# Patient Record
Sex: Male | Born: 1950 | Race: White | Hispanic: No | Marital: Single
Health system: Southern US, Community
[De-identification: ages and names within clinical notes are randomized; demographics above are authoritative.]

## PROBLEM LIST (undated history)

## (undated) DIAGNOSIS — E871 Hypo-osmolality and hyponatremia: Secondary | ICD-10-CM

## (undated) DIAGNOSIS — F1010 Alcohol abuse, uncomplicated: Secondary | ICD-10-CM

## (undated) DIAGNOSIS — E119 Type 2 diabetes mellitus without complications: Secondary | ICD-10-CM

## (undated) DIAGNOSIS — I10 Essential (primary) hypertension: Secondary | ICD-10-CM

---

## 2011-09-30 DIAGNOSIS — F1020 Alcohol dependence, uncomplicated: Secondary | ICD-10-CM | POA: Insufficient documentation

## 2013-12-04 DIAGNOSIS — F17200 Nicotine dependence, unspecified, uncomplicated: Secondary | ICD-10-CM | POA: Insufficient documentation

## 2013-12-04 DIAGNOSIS — E118 Type 2 diabetes mellitus with unspecified complications: Secondary | ICD-10-CM | POA: Insufficient documentation

## 2013-12-04 DIAGNOSIS — I10 Essential (primary) hypertension: Secondary | ICD-10-CM | POA: Insufficient documentation

## 2014-04-20 DIAGNOSIS — C20 Malignant neoplasm of rectum: Secondary | ICD-10-CM | POA: Insufficient documentation

## 2020-12-29 ENCOUNTER — Inpatient Hospital Stay
Admission: EM | Admit: 2020-12-29 | Discharge: 2021-01-04 | DRG: 640 | Disposition: A | Discharge status: Home Health | Payer: Medicare PPO | Attending: Obstetrics and Gynecology | Admitting: Obstetrics and Gynecology

## 2020-12-29 ENCOUNTER — Encounter: Payer: Self-pay | Admitting: Emergency Medicine

## 2020-12-29 ENCOUNTER — Emergency Department: Payer: Medicare PPO

## 2020-12-29 ENCOUNTER — Other Ambulatory Visit: Payer: Self-pay

## 2020-12-29 DIAGNOSIS — F32A Depression, unspecified: Secondary | ICD-10-CM | POA: Diagnosis present

## 2020-12-29 DIAGNOSIS — Z20822 Contact with and (suspected) exposure to covid-19: Secondary | ICD-10-CM | POA: Diagnosis present

## 2020-12-29 DIAGNOSIS — R5381 Other malaise: Secondary | ICD-10-CM | POA: Diagnosis present

## 2020-12-29 DIAGNOSIS — E861 Hypovolemia: Secondary | ICD-10-CM | POA: Diagnosis present

## 2020-12-29 DIAGNOSIS — G9341 Metabolic encephalopathy: Secondary | ICD-10-CM | POA: Diagnosis not present

## 2020-12-29 DIAGNOSIS — E1140 Type 2 diabetes mellitus with diabetic neuropathy, unspecified: Secondary | ICD-10-CM | POA: Diagnosis present

## 2020-12-29 DIAGNOSIS — E785 Hyperlipidemia, unspecified: Secondary | ICD-10-CM | POA: Diagnosis present

## 2020-12-29 DIAGNOSIS — F10931 Alcohol use, unspecified with withdrawal delirium: Secondary | ICD-10-CM

## 2020-12-29 DIAGNOSIS — Z85048 Personal history of other malignant neoplasm of rectum, rectosigmoid junction, and anus: Secondary | ICD-10-CM

## 2020-12-29 DIAGNOSIS — D649 Anemia, unspecified: Secondary | ICD-10-CM | POA: Diagnosis present

## 2020-12-29 DIAGNOSIS — D6959 Other secondary thrombocytopenia: Secondary | ICD-10-CM | POA: Diagnosis present

## 2020-12-29 DIAGNOSIS — N400 Enlarged prostate without lower urinary tract symptoms: Secondary | ICD-10-CM | POA: Diagnosis present

## 2020-12-29 DIAGNOSIS — E871 Hypo-osmolality and hyponatremia: Secondary | ICD-10-CM | POA: Diagnosis present

## 2020-12-29 DIAGNOSIS — E876 Hypokalemia: Secondary | ICD-10-CM | POA: Diagnosis not present

## 2020-12-29 DIAGNOSIS — G928 Other toxic encephalopathy: Secondary | ICD-10-CM | POA: Diagnosis present

## 2020-12-29 DIAGNOSIS — A084 Viral intestinal infection, unspecified: Secondary | ICD-10-CM | POA: Diagnosis present

## 2020-12-29 DIAGNOSIS — I10 Essential (primary) hypertension: Secondary | ICD-10-CM | POA: Diagnosis present

## 2020-12-29 DIAGNOSIS — F1010 Alcohol abuse, uncomplicated: Secondary | ICD-10-CM | POA: Diagnosis not present

## 2020-12-29 DIAGNOSIS — F10231 Alcohol dependence with withdrawal delirium: Secondary | ICD-10-CM | POA: Diagnosis not present

## 2020-12-29 DIAGNOSIS — R4182 Altered mental status, unspecified: Secondary | ICD-10-CM

## 2020-12-29 HISTORY — DX: Essential (primary) hypertension: I10

## 2020-12-29 HISTORY — DX: Alcohol abuse, uncomplicated: F10.10

## 2020-12-29 HISTORY — DX: Hypo-osmolality and hyponatremia: E87.1

## 2020-12-29 HISTORY — DX: Type 2 diabetes mellitus without complications: E11.9

## 2020-12-29 LAB — CBC WITH DIFFERENTIAL/PLATELET
Abs Immature Granulocytes: 0.28 10*3/uL — ABNORMAL HIGH (ref 0.00–0.07)
Basophils Absolute: 0 10*3/uL (ref 0.0–0.1)
Basophils Relative: 0 %
Eosinophils Absolute: 0 10*3/uL (ref 0.0–0.5)
Eosinophils Relative: 0 %
Hemoglobin: 15.1 g/dL (ref 13.0–17.0)
Immature Granulocytes: 2 %
Lymphocytes Relative: 3 %
Lymphs Abs: 0.6 10*3/uL — ABNORMAL LOW (ref 0.7–4.0)
Monocytes Absolute: 1.1 10*3/uL — ABNORMAL HIGH (ref 0.1–1.0)
Monocytes Relative: 6 %
Neutro Abs: 16.1 10*3/uL — ABNORMAL HIGH (ref 1.7–7.7)
Neutrophils Relative %: 89 %
Platelets: 126 10*3/uL — ABNORMAL LOW (ref 150–400)
Smear Review: NORMAL
WBC: 18.2 10*3/uL — ABNORMAL HIGH (ref 4.0–10.5)

## 2020-12-29 LAB — URINE DRUG SCREEN, QUALITATIVE (ARMC ONLY)
Amphetamines, Ur Screen: NOT DETECTED
Barbiturates, Ur Screen: NOT DETECTED
Benzodiazepine, Ur Scrn: NOT DETECTED
Cannabinoid 50 Ng, Ur ~~LOC~~: NOT DETECTED
Cocaine Metabolite,Ur ~~LOC~~: NOT DETECTED
MDMA (Ecstasy)Ur Screen: NOT DETECTED
Methadone Scn, Ur: NOT DETECTED
Opiate, Ur Screen: NOT DETECTED
Phencyclidine (PCP) Ur S: NOT DETECTED
Tricyclic, Ur Screen: NOT DETECTED

## 2020-12-29 LAB — SODIUM
Sodium: <102 mmol/L — CL (ref 135–145)
Sodium: <102 mmol/L — CL (ref 135–145)
Sodium: 103 mmol/L — CL (ref 135–145)
Sodium: 105 mmol/L — CL (ref 135–145)

## 2020-12-29 LAB — URINALYSIS, ROUTINE W REFLEX MICROSCOPIC
Bilirubin Urine: NEGATIVE
Glucose, UA: 150 mg/dL — AB
Ketones, ur: 20 mg/dL — AB
Leukocytes,Ua: NEGATIVE
Nitrite: NEGATIVE
Protein, ur: 100 mg/dL — AB
Specific Gravity, Urine: 1.013 (ref 1.005–1.030)
Squamous Epithelial / HPF: NONE SEEN (ref 0–5)
pH: 6 (ref 5.0–8.0)

## 2020-12-29 LAB — RESP PANEL BY RT-PCR (FLU A&B, COVID) ARPGX2
Influenza A by PCR: NEGATIVE
Influenza B by PCR: NEGATIVE
SARS Coronavirus 2 by RT PCR: NEGATIVE

## 2020-12-29 LAB — COMPREHENSIVE METABOLIC PANEL
ALT: 41 U/L (ref 0–44)
AST: 108 U/L — ABNORMAL HIGH (ref 15–41)
Albumin: 4 g/dL (ref 3.5–5.0)
Alkaline Phosphatase: 48 U/L (ref 38–126)
BUN: 16 mg/dL (ref 8–23)
CO2: 26 mmol/L (ref 22–32)
Calcium: 8.4 mg/dL — ABNORMAL LOW (ref 8.9–10.3)
Chloride: <65 mmol/L — CL (ref 98–111)
Creatinine, Ser: 0.94 mg/dL (ref 0.61–1.24)
GFR, Estimated: >60 mL/min (ref >60)
Glucose, Bld: 156 mg/dL — ABNORMAL HIGH (ref 70–99)
Potassium: 3.6 mmol/L (ref 3.5–5.1)
Sodium: <102 mmol/L — CL (ref 135–145)
Total Bilirubin: 1.7 mg/dL — ABNORMAL HIGH (ref 0.3–1.2)
Total Protein: 7 g/dL (ref 6.5–8.1)

## 2020-12-29 LAB — OSMOLALITY, URINE: Osmolality, Ur: 384 mOsm/kg (ref 300–900)

## 2020-12-29 LAB — LIPASE, BLOOD: Lipase: 46 U/L (ref 11–51)

## 2020-12-29 LAB — MRSA NEXT GEN BY PCR, NASAL: MRSA by PCR Next Gen: NOT DETECTED

## 2020-12-29 LAB — ETHANOL: Alcohol, Ethyl (B): <10 mg/dL (ref <10)

## 2020-12-29 LAB — SODIUM, URINE, RANDOM: Sodium, Ur: <10 mmol/L

## 2020-12-29 LAB — GLUCOSE, CAPILLARY: Glucose-Capillary: 126 mg/dL — ABNORMAL HIGH (ref 70–99)

## 2020-12-29 LAB — OSMOLALITY: Osmolality: 213 mOsm/kg — CL (ref 275–295)

## 2020-12-29 MED ORDER — CALCIUM GLUCONATE-NACL 1-0.675 GM/50ML-% IV SOLN
1.0000 g | Freq: Once | INTRAVENOUS | Status: AC
  Administered 2020-12-29: 1000 mg via INTRAVENOUS
  Filled 2020-12-29: qty 50

## 2020-12-29 MED ORDER — FOLIC ACID 1 MG PO TABS
1.0000 mg | ORAL_TABLET | Freq: Every day | ORAL | Status: DC
  Administered 2020-12-31 – 2021-01-04 (×5): 1 mg via ORAL
  Filled 2020-12-29 (×5): qty 1

## 2020-12-29 MED ORDER — DOCUSATE SODIUM 100 MG PO CAPS: 100.0000 mg | ORAL_CAPSULE | Freq: Two times a day (BID) | ORAL | Status: DC | PRN

## 2020-12-29 MED ORDER — THIAMINE HCL 100 MG/ML IJ SOLN
250.0000 mg | INTRAVENOUS | Status: AC
  Administered 2021-01-01 – 2021-01-02 (×2): 250 mg via INTRAVENOUS
  Filled 2020-12-29 (×2): qty 2.5

## 2020-12-29 MED ORDER — ACETAMINOPHEN 325 MG PO TABS
650.0000 mg | ORAL_TABLET | ORAL | Status: DC | PRN
  Administered 2020-12-31: 650 mg via ORAL
  Filled 2020-12-29: qty 2

## 2020-12-29 MED ORDER — LORAZEPAM 1 MG PO TABS: 1.0000 mg | ORAL_TABLET | ORAL | Status: AC | PRN

## 2020-12-29 MED ORDER — LORAZEPAM 2 MG/ML IJ SOLN
1.0000 mg | INTRAMUSCULAR | Status: AC | PRN
  Administered 2020-12-29: 3 mg via INTRAVENOUS
  Administered 2020-12-29: 2 mg via INTRAVENOUS
  Administered 2020-12-29: 4 mg via INTRAVENOUS
  Administered 2020-12-29: 2 mg via INTRAVENOUS
  Filled 2020-12-29: qty 2
  Filled 2020-12-29 (×2): qty 1
  Filled 2020-12-29: qty 2

## 2020-12-29 MED ORDER — LORAZEPAM 2 MG/ML IJ SOLN
INTRAMUSCULAR | Status: AC
  Administered 2020-12-29: 2 mg via INTRAVENOUS
  Filled 2020-12-29: qty 1

## 2020-12-29 MED ORDER — SODIUM CHLORIDE 3 % IV SOLN
INTRAVENOUS | Status: DC
  Filled 2020-12-29 (×3): qty 500

## 2020-12-29 MED ORDER — POLYETHYLENE GLYCOL 3350 17 G PO PACK: 17.0000 g | PACK | Freq: Every day | ORAL | Status: DC | PRN

## 2020-12-29 MED ORDER — THIAMINE HCL 100 MG/ML IJ SOLN: 100.0000 mg | Freq: Every day | INTRAMUSCULAR | Status: DC

## 2020-12-29 MED ORDER — THIAMINE HCL 100 MG/ML IJ SOLN
100.0000 mg | Freq: Every day | INTRAMUSCULAR | Status: DC
  Administered 2021-01-03: 100 mg via INTRAVENOUS
  Filled 2020-12-29 (×2): qty 2

## 2020-12-29 MED ORDER — THIAMINE HCL 100 MG PO TABS: 100.0000 mg | ORAL_TABLET | Freq: Every day | ORAL | Status: DC

## 2020-12-29 MED ORDER — ONDANSETRON HCL 4 MG/2ML IJ SOLN: 4.0000 mg | Freq: Four times a day (QID) | INTRAMUSCULAR | Status: DC | PRN

## 2020-12-29 MED ORDER — HEPARIN SODIUM (PORCINE) 5000 UNIT/ML IJ SOLN
5000.0000 [IU] | Freq: Three times a day (TID) | INTRAMUSCULAR | Status: DC
  Administered 2020-12-29 – 2020-12-31 (×5): 5000 [IU] via SUBCUTANEOUS
  Filled 2020-12-29 (×5): qty 1

## 2020-12-29 MED ORDER — ADULT MULTIVITAMIN W/MINERALS CH
1.0000 | ORAL_TABLET | Freq: Every day | ORAL | Status: DC
  Administered 2020-12-31 – 2021-01-04 (×5): 1 via ORAL
  Filled 2020-12-29 (×5): qty 1

## 2020-12-29 MED ORDER — DEXMEDETOMIDINE HCL IN NACL 400 MCG/100ML IV SOLN
0.1000 ug/kg/h | INTRAVENOUS | Status: AC
  Administered 2020-12-29: 0.4 ug/kg/h via INTRAVENOUS
  Administered 2020-12-30: 0.2 ug/kg/h via INTRAVENOUS
  Filled 2020-12-29 (×2): qty 100

## 2020-12-29 MED ORDER — THIAMINE HCL 100 MG/ML IJ SOLN
500.0000 mg | Freq: Three times a day (TID) | INTRAVENOUS | Status: AC
  Administered 2020-12-29 – 2020-12-31 (×8): 500 mg via INTRAVENOUS
  Filled 2020-12-29 (×8): qty 5

## 2020-12-29 MED ORDER — CHLORHEXIDINE GLUCONATE CLOTH 2 % EX PADS
6.0000 | MEDICATED_PAD | Freq: Every day | CUTANEOUS | Status: DC
  Administered 2020-12-29 – 2021-01-01 (×6): 6 via TOPICAL

## 2020-12-29 NOTE — ED Notes (Addendum)
This NT put patients things in bag with RN Karena Addison  1 [employer] wallet  1 Blue jacket  1 Black charger   1 Pair of black shoes  1 Pair of gray socks  1 Sliver key  Multiple keys 1 Camo Hat  1 Camo pants  1 Brown belt  1 [employer]  1 [employer]

## 2020-12-29 NOTE — Progress Notes (Addendum)
[facility] Progress Note Patient Name: Daemien Fronczak DOB: 1950/09/18 MRN: [id number]   Date of Service  12/29/2020  HPI/Events of Note  Patient with inadequate attenuation of delirium with CIWA Protocol Ativan.  eICU Interventions  Low dose Precedex gtt added. Serum sodium up 3 meq in past 9 hours, which is an acceptable rate of increase, will continue to monitor closely.        Kerry Kass Noah Pelaez 12/29/2020, 10:52 PM

## 2020-12-29 NOTE — Consult Note (Addendum)
PHARMACY CONSULT NOTE - FOLLOW UP  Pharmacy Consult for Electrolyte Monitoring and Replacement   Recent Labs: Potassium (mmol/L)  Date Value  12/29/2020 3.6   Calcium (mg/dL)  Date Value  12/29/2020 8.4 (L)   Albumin (g/dL)  Date Value  12/29/2020 4.0   Sodium (mmol/L)  Date Value  12/29/2020 105 (LL)     Assessment: 70 y/o male presented with N/V/D x 1 week. Found to have severely low Na of < 102, Cl < 65. Initiated on 3% NaCl infusion at 25 mL/hr. Pharmacy has been consulted to monitor and replace electrolytes as needed  12/5 1321  <102 12/5 1421  3% saline drip started 12/5 1548  <102 12/5 1826  103 12/5 2143  105  Goal of Therapy:  Electrolytes WNL No increase in Na in a 2 hr period > 4 mEq/L No increase in Na in a 4 hr period > 6 mEq/L No increase in Na in a 24 hr period > 8 mEq/L   Plan:  Hyponatremia -- continue 3% NaCl at 25 ml/hr per nephology. Check Na q4h Recheck BMP, Mag, Phos with AM labs   Trevor Meyers D ,PharmD Clinical Pharmacist 12/29/2020 10:44 PM

## 2020-12-29 NOTE — ED Triage Notes (Signed)
Pt in with co n.v.d for a week, per EMS pt has also had AMS. Pt states hx of the same and had low sodium. Pt denies any pain, alert and awake at this time.

## 2020-12-29 NOTE — H&P (Addendum)
NAME:  Trevor Meyers, MRN:  [id number], DOB:  1950-11-11, LOS: 0 ADMISSION DATE:  12/29/2020, CONSULTATION DATE:  12/29/20 REFERRING MD:  Dr. Charna Archer, CHIEF COMPLAINT:  AMS, generalized weakness, s/p fall, N/V/D   Brief Pt Description / Synopsis:  70 y.o. Male admitted with Acute Metabolic Encephalopathy in the setting of severe Hyponatremia (Hypotonic Hypovolemic) due to GI losses, along with developing Delirium Tremens.  History of Present Illness:  Trevor Meyers is a 70 year old male with a past medical history significant for alcohol abuse (reports 6-8 beers per day), hyponatremia requiring hospitalization, hypertension, diabetes mellitus who presented to [facility] ED on 12/29/2020 due to complaints of generalized weakness status post fall at home, nausea, vomiting, diarrhea.  Patient is currently confused and seems to be exhibiting signs of developing DT's upon my examination the ED, however no focal neuro deficits noted and patient is able to contribute to history. He reports about a 1 week history of persistent nausea, vomiting (non bloody), diarrhea (non bloody). He denies chest pain,  palpitations, dizziness, cough, shortness of breath, fever, chills, dysuria, abdominal pain, hematemesis or hematochezia.  Over the past 3 days he began to have progressive weakness and began to feel unsteady on his feet, and reported he had a fall this morning and hit his head.  Upon EMS arrival he was noted to be slightly disoriented.  EMS gave a 500 cc bolus of normal saline.  ED Course: Initial vital signs: Temperature 98.4 F orally, respiratory rate 20, pulse 79, blood pressure 190/89, SPO2 96% on room air Significant labs: Sodium < 102, chloride less than 65, glucose 156, BUN 16, creatinine 0.94, alkaline phosphatase 48, lipase 46, AST 108, ALT 41, WBC 18.2, platelets 126 (smear unremarkable), serum ethyl alcohol <10, serum osmolality 213 SARS-CoV-2 and influenza PCR negative Urine drug screen is pending Urine  osmolality and urine sodium pending Imaging: CT head and CT cervical spine>>IMPRESSION: 1. No acute intracranial abnormalities. 2. No acute abnormality of the cervical spine. 3. Mild cerebral atrophy with chronic microvascular ischemic changes in the cerebral white matter, as above. 4. Mild multilevel degenerative disc disease and cervical spondylosis, as above. Chest x-ray>>No active cardiopulmonary disease. Medications given: 3% hypertonic saline infusion at 25 mils per hour  PCCM is asked to admit the patient to ICU for further work-up and treatment of Acute Metabolic Encephalopathy in the setting of severe Hyponatremia (Hypotonic Hypovolemic) due to GI losses, along with developing Delirium Tremens.  Nephrology is consulted.   Pertinent  Medical History  Alcohol Abuse Hyponatremia Hypertension Hyperlipidemia Diabetes Mellitus  Micro Data:  12/29/2020: SARS-CoV-2 and influenza PCR>> negative 12/29/2020: GI panel by PCR>> 12/29/2020: C. Difficile>>  Antimicrobials:  N/A  Significant Hospital Events: Including procedures, antibiotic start and stop dates in addition to other pertinent events   12/29/2020: Presented to ED, found to have severe hyponatremia requiring 3% hypertonic saline.  PCCM asked to admit, nephrology consulted.  Starting to exhibit signs of developing DTs.  Interim History / Subjective:  -Patient presented to ED due to weakness with associated fall earlier today, reports about a day history of nausea, vomiting, diarrhea -CT Head and CT cervical spine both negative -Found to have severe hyponatremia with serum sodium less than 102 -Received 500 cc normal saline bolus with EMS, placed on 3% hypertonic saline in the ED -PCCM asked to admit, nephrology consulted -Upon examination in the ED patient exhibiting signs of developing DTs (anxious/restless, diaphoretic, tremulous) -He reports he drinks approximately 6-8 beers per day, with his last drink  the night before  last -Enacting CIWA protocol -Pt reports nausea ~ last known emesis or diarrhea was last night per his report ~ check GI panel & C-diff -Currently protecting his airway, but high risk for intubation  Objective   Blood pressure (!) 155/77, pulse 76, temperature 98.4 F (36.9 C), temperature source Oral, resp. rate 19, height 6\' 1"  (1.854 m), weight 93 kg, SpO2 97 %.       No intake or output data in the 24 hours ending 12/29/20 1402 Filed Weights   12/29/20 1130  Weight: 93 kg    Examination: General: Acutely ill-appearing male, laying in bed, on room air, anxious/restless/tremulous/diaphoretic HENT: Atraumatic, normocephalic, neck supple, no JVD Lungs: Clear breath sounds to auscultation bilaterally, no wheezing or rales noted, even, nonlabored Cardiovascular: Regular rate and rhythm, S1-S2, no murmurs, rubs, gallops Abdomen: Soft, nontender, nondistended, no guarding rebound tenderness, bowel sounds positive x4 Extremities: Normal bulk and tone, generalized weakness, no deformities, no edema Neuro: Awake but slightly confused, answers questions appropriately, moves all extremities to commands, no focal deficits, pupils PERRLA GU: Deferred Skin: Warm and diaphoretic.  No obvious rashes, lesions, ulcerations  Resolved Hospital Problem list     Assessment & Plan:   Severe Hyponatremia (Hypotonic Hypovolemic) due to GI losses over the last week -Monitor I&O's / urinary output -Follow BMP -Ensure adequate renal perfusion -Avoid nephrotoxic agents as able -Replace electrolytes as indicated -Serum Osmolality 213 -Urine osmolality, & urine Na+ pending -Hypertonic 3% Saline infusion ~ pharmacy following -Follow Serum Na+ q2h x2, then q4h -Goal rate of Correction: 4-6 mEq in 24 hrs (MAX RATE OF CORRECTION: 8 mEq in 24 hrs) -Nephrology consulted, appreciate input  Acute Metabolic Encephalopathy in the setting of severe Hyponatremia High risk for development of DT's PMHx: ETOH  abuse (pt reports 6-8 beers/day) -Correction of Na+ as above -CT Head negative for any acute intracranial process 12/5 -Urine drug screen is pending -Serum Ethyl alcohol  <10 -CIWA Protocol -Precedex if needed -Thiamine, Folic acid, MV supplementation -Seizure precautions -Currently protecting his airway, but HIGH RISK FOR INTUBATION -Provide supportive care  Leukocytosis, suspect due to Viral Gastroenteritis  -Monitor fever curve -Trend WBC's & Procalcitonin -Follow cultures as above -CXR 12/29/20 without acute process -Urinalysis is pending -Abdominal exam is benign ~ will check GI panel and C-diff panel if any further diarrhea -Will hold off on empiric antibiotics at this time pending cultures & sensitivities  Mild Thrombocytopenia, suspect due to ETOH abuse -Monitor for S/Sx of bleeding -Trend CBC -Heparin SQ for VTE Prophylaxis  -Transfuse for Hgb <7 -Smear is normal      Best Practice (right click and "Reselect all SmartList Selections" daily)   Diet/type: NPO DVT prophylaxis: prophylactic heparin  GI prophylaxis: PPI Lines: N/A Foley:  N/A Code Status:  full code Last date of multidisciplinary goals of care discussion [N/A]  Pt is critically ill due to severe hyponatremia and developing DT's.  He is high risk for seizures, intubation, cardiac arrest, and death.  Labs   CBC: Recent Labs  Lab 12/29/20 1132  WBC 18.2*  NEUTROABS 16.1*  HGB 15.1  HCT RESULTS UNAVAILABLE DUE TO INTERFERING SUBSTANCE  MCV RESULTS UNAVAILABLE DUE TO INTERFERING SUBSTANCE  PLT 126*    Basic Metabolic Panel: Recent Labs  Lab 12/29/20 1132 12/29/20 1321  NA <102* <102*  K 3.6  --   CL <65*  --   CO2 26  --   GLUCOSE 156*  --   BUN 16  --  CREATININE 0.94  --   CALCIUM 8.4*  --    GFR: Estimated Creatinine Clearance: 83.8 mL/min (by C-G formula based on SCr of 0.94 mg/dL). Recent Labs  Lab 12/29/20 1132  WBC 18.2*    Liver Function Tests: Recent Labs  Lab  12/29/20 1132  AST 108*  ALT 41  ALKPHOS 48  BILITOT 1.7*  PROT 7.0  ALBUMIN 4.0   Recent Labs  Lab 12/29/20 1132  LIPASE 46   No results for input(s): AMMONIA in the last 168 hours.  ABG No results found for: PHART, PCO2ART, PO2ART, HCO3, TCO2, ACIDBASEDEF, O2SAT   Coagulation Profile: No results for input(s): INR, PROTIME in the last 168 hours.  Cardiac Enzymes: No results for input(s): CKTOTAL, CKMB, CKMBINDEX, TROPONINI in the last 168 hours.  HbA1C: No results found for: HGBA1C  CBG: No results for input(s): GLUCAP in the last 168 hours.  Review of Systems:   Positives in BOLD: Gen: Denies fever, chills, weight change, fatigue, night sweats HEENT: Denies blurred vision, double vision, hearing loss, tinnitus, sinus congestion, rhinorrhea, sore throat, neck stiffness, dysphagia PULM: Denies shortness of breath, cough, sputum production, hemoptysis, wheezing CV: Denies chest pain, edema, orthopnea, paroxysmal nocturnal dyspnea, palpitations GI: Denies abdominal pain, nausea, vomiting, diarrhea, hematochezia, melena, constipation, change in bowel habits GU: Denies dysuria, hematuria, polyuria, oliguria, urethral discharge Endocrine: Denies hot or cold intolerance, polyuria, polyphagia or appetite change Derm: Denies rash, dry skin, scaling or peeling skin change Heme: Denies easy bruising, bleeding, bleeding gums Neuro: Denies headache, numbness, generalized weakness, slurred speech, loss of memory or consciousness   Past Medical History:  He,  has a past medical history of Alcohol abuse, Diabetes ([place]), Hypertension, and Hyponatremia.   Surgical History:  History reviewed. No pertinent surgical history.   Social History:      Family History:  His family history is not on file.   Allergies Not on File   Home Medications  Prior to Admission medications   Not on File     Critical care time: 55 minutes     Darel Hong, AGACNP-BC [place] epic messenger for cross cover needs If after hours, please call E-link

## 2020-12-29 NOTE — Consult Note (Signed)
PHARMACY CONSULT NOTE - FOLLOW UP  Pharmacy Consult for Electrolyte Monitoring and Replacement   Recent Labs: Potassium (mmol/L)  Date Value  12/29/2020 3.6   Calcium (mg/dL)  Date Value  12/29/2020 8.4 (L)   Albumin (g/dL)  Date Value  12/29/2020 4.0   Sodium (mmol/L)  Date Value  12/29/2020 <102 (LL)     Assessment: 70 y/o male presented with N/V/D x 1 week. Found to have severely low Na of < 102, Cl < 65. Initiated on 3% NaCl infusion at 25 mL/hr. Pharmacy has been consulted to monitor and replace electrolytes as needed  12/5 1321  <102 12/5 1421  3% saline drip started 12/5 1548  <102  Goal of Therapy:  Electrolytes WNL  Plan:  Hyponatremia -- continue 3% NaCl at 25 ml/hr per nephology. Check Na again in 2 hrs then q4h Hypocalcemia -- IV calcium gluconate 1 gram x 1  Recheck BMP, Mag, Phos with AM labs   Noralee Space ,PharmD Clinical Pharmacist 12/29/2020 5:37 PM

## 2020-12-29 NOTE — ED Notes (Signed)
Pt tolerated 2-3 sips of water.

## 2020-12-29 NOTE — ED Provider Notes (Signed)
[facility] Emergency Department Provider Note   ____________________________________________   Event Date/Time   First MD Initiated Contact with Patient 12/29/20 1248     (approximate)  I have reviewed the triage vital signs and the nursing notes.   HISTORY  Chief Complaint Vomiting and Diarrhea    HPI Trevor Meyers is a 70 y.o. male with past medical history of hypertension, diabetes, and alcohol abuse who presents to the ED for vomiting and diarrhea.  Patient reports that he has had 1 week of persistent nausea, vomiting, and diarrhea.  He states he has begun to feel unsteady on his feet over the past 3 days, having significant difficulty walking on his own.  He is feeling much weaker than usual and per EMS has been slightly disoriented.  He was given 500 cc bolus of normal saline by EMS, currently denies any abdominal pain and has not noticed any blood in his emesis or stool.  He reports issues with low sodium in the past requiring admission.  He does admit to alcohol consumption recently, about 6-8 beers daily.        Past Medical History:  Diagnosis Date   Alcohol abuse    Diabetes ([place])    Hypertension    Hyponatremia     There are no problems to display for this patient.   History reviewed. No pertinent surgical history.  Prior to Admission medications   Not on File    Allergies Patient has no allergy information on record.  No family history on file.  Social History    Review of Systems  Constitutional: No fever/chills.  Positive for confusion. Eyes: No visual changes. ENT: No sore throat. Cardiovascular: Denies chest pain. Respiratory: Denies shortness of breath. Gastrointestinal: No abdominal pain.  No nausea, no vomiting.  No diarrhea.  No constipation. Genitourinary: Negative for dysuria. Musculoskeletal: Negative for back pain. Skin: Negative for rash. Neurological: Negative for headaches, positive for unsteady gait,  negative for focal weakness or numbness.  ____________________________________________   PHYSICAL EXAM:  VITAL SIGNS: ED Triage Vitals  Enc Vitals Group     BP 12/29/20 1127 (!) 190/89     Pulse Rate 12/29/20 1125 79     Resp 12/29/20 1125 20     Temp 12/29/20 1125 98.4 F (36.9 C)     Temp Source 12/29/20 1125 Oral     SpO2 12/29/20 1125 96 %     Weight 12/29/20 1130 205 lb (93 kg)     Height 12/29/20 1130 6\' 1"  (1.854 m)     Head Circumference --      Peak Flow --      Pain Score 12/29/20 1125 0     Pain Loc --      Pain Edu? --      Excl. in [place]? --     Constitutional: Alert and oriented to person, place, time, and situation. Eyes: Conjunctivae are normal. Head: Atraumatic. Nose: No congestion/rhinnorhea. Mouth/Throat: Mucous membranes are dry. Neck: Normal ROM Cardiovascular: Normal rate, regular rhythm. Grossly normal heart sounds.  2+ radial pulses bilaterally. Respiratory: Normal respiratory effort.  No retractions. Lungs CTAB. Gastrointestinal: Soft and nontender. No distention. Genitourinary: deferred Musculoskeletal: No lower extremity tenderness nor edema. Neurologic:  Normal speech and language.  Global weakness noted with no gross focal neurologic deficits appreciated. Skin:  Skin is warm, dry and intact. No rash noted. Psychiatric: Mood and affect are normal. Speech and behavior are normal.  ____________________________________________   LABS (all labs  ordered are listed, but only abnormal results are displayed)  Labs Reviewed  COMPREHENSIVE METABOLIC PANEL - Abnormal; Notable for the following components:      Result Value   Sodium <102 (*)    Chloride <65 (*)    Glucose, Bld 156 (*)    Calcium 8.4 (*)    AST 108 (*)    Total Bilirubin 1.7 (*)    All other components within normal limits  CBC WITH DIFFERENTIAL/PLATELET - Abnormal; Notable for the following components:   WBC 18.2 (*)    Platelets 126 (*)    Neutro Abs 16.1 (*)    Lymphs Abs  0.6 (*)    Monocytes Absolute 1.1 (*)    Abs Immature Granulocytes 0.28 (*)    All other components within normal limits  RESP PANEL BY RT-PCR (FLU A&B, COVID) ARPGX2  LIPASE, BLOOD  URINALYSIS, ROUTINE W REFLEX MICROSCOPIC  ETHANOL  OSMOLALITY, URINE  SODIUM, URINE, RANDOM  SODIUM  OSMOLALITY  SODIUM  SODIUM  SODIUM   ____________________________________________  EKG  ED ECG REPORT I, Blake Divine, the attending physician, personally viewed and interpreted this ECG.   Date: 12/29/2020  EKG Time: 11:46  Rate: 74  Rhythm: normal sinus rhythm  Axis: Normal  Intervals: Prolonged QT  ST&T Change: None    PROCEDURES  Procedure(s) performed (including Critical Care):  .Critical Care Performed by: Blake Divine, MD Authorized by: Blake Divine, MD   Critical care provider statement:    Critical care time (minutes):  45   Critical care time was exclusive of:  Separately billable procedures and treating other patients and teaching time   Critical care was necessary to treat or prevent imminent or life-threatening deterioration of the following conditions:  Metabolic crisis   Critical care was time spent personally by me on the following activities:  Development of treatment plan with patient or surrogate, discussions with consultants, evaluation of patient's response to treatment, examination of patient, ordering and review of laboratory studies, ordering and review of radiographic studies, ordering and performing treatments and interventions, pulse oximetry, re-evaluation of patient's condition and review of old charts   I assumed direction of critical care for this patient from another provider in my specialty: no     Care discussed with: admitting provider     ____________________________________________   INITIAL IMPRESSION / ASSESSMENT AND PLAN / ED COURSE      70 year old male with past medical history of hypertension, diabetes, and alcohol abuse who  presents to the ED with nausea, vomiting, and diarrhea for the past week now associated with unsteady gait and mild confusion.  Patient is slightly disoriented on my assessment, but has no focal neurologic deficits on exam and answers orientation questions appropriately.  CT head and cervical spine were obtained from triage and negative for acute process, chest x-ray reviewed by me with no infiltrate, edema, or effusion.  Labs remarkable for critically low sodium at less than 102, no other associated electrolyte abnormality.  Findings discussed with Dr. Juleen China of nephrology, who recommends starting patient on 3% hypertonic saline at 25 cc an hour, we will hold off on IV fluid bolus given risk for overcorrection.  Case discussed with ICU team for admission.      ____________________________________________   FINAL CLINICAL IMPRESSION(S) / ED DIAGNOSES  Final diagnoses:  Hyponatremia  Altered mental status, unspecified altered mental status type  Alcohol abuse     ED Discharge Orders     None  Note:  This document was prepared using Dragon voice recognition software and may include unintentional dictation errors.    Blake Divine, MD 12/29/20 1336

## 2020-12-29 NOTE — ED Notes (Signed)
MD bedside

## 2020-12-29 NOTE — ED Notes (Signed)
Condom cath placed in replace of male primofit  Pt constantly touching self in that area and pulling.

## 2020-12-29 NOTE — Consult Note (Signed)
PHARMACY CONSULT NOTE - FOLLOW UP  Pharmacy Consult for Electrolyte Monitoring and Replacement   Recent Labs: Potassium (mmol/L)  Date Value  12/29/2020 3.6   Calcium (mg/dL)  Date Value  12/29/2020 8.4 (L)   Albumin (g/dL)  Date Value  12/29/2020 4.0   Sodium (mmol/L)  Date Value  12/29/2020 <102 (LL)     Assessment: 70 y/o male presented with N/V/D x 1 week. Found to have severely low Na of < 102, Cl < 65. Initiated on 3% NaCl infusion at 25 mL/hr. Pharmacy has been consulted to monitor and replace electrolytes as needed  Goal of Therapy:  Electrolytes WNL  Plan:  Hyponatremia -- continue 3% NaCl per nephology Hypocalcemia -- IV calcium gluconate 1 gram x 1  Recheck BMP, Mag, Phos with AM labs   Trevor Meyers ,PharmD, BCPS Clinical Pharmacist 12/29/2020 2:38 PM

## 2020-12-29 NOTE — ED Notes (Signed)
Pt has now tolerated a bag of chips, a couple crackers and a cup of water. He will occasionally say he has a RLQ sharp pain but the pain only last moments.  Pt is calm and aware of surroundings for the most part.

## 2020-12-29 NOTE — ED Provider Notes (Signed)
  Emergency Medicine Provider Triage Evaluation Note  Trevor Meyers , a 70 y.o.male,  was evaluated in triage.  Pt complains of nausea, vomiting, diarrhea.  Patient states that this has been going on for the past 8 days.  He is not currently nauseous at this time, but does endorse some weakness and confusion.  Patient states that he additionally fell and hit his head on a wall earlier today as well.  After that he decided to call EMS.  Given a saline bolus prehospital setting.  Denies abdominal pain, back pain, or shortness of breath.   Review of Systems  Positive: Nausea, vomiting, diarrhea. Negative: Denies fever, chest pain, vomiting  Physical Exam  There were no vitals filed for this visit. Gen:   Awake, ppears uncomfortable Resp:  Normal effort  MSK:   Moves extremities without difficulty  Other:    Medical Decision Making  Given the patient's initial medical screening exam, the following diagnostic evaluation has been ordered. The patient will be placed in the appropriate treatment space, once one is available, to complete the evaluation and treatment. I have discussed the plan of care with the patient and I have advised the patient that an ED physician or mid-level practitioner will reevaluate their condition after the test results have been received, as the results may give them additional insight into the type of treatment they may need.    Diagnostics: Labs, EKG, CXR, UA, head CT, C-spine CT  Treatments: none immediately   Teodoro Spray, PA 12/29/20 1133    Lavonia Drafts, MD 12/29/20 1153

## 2020-12-29 NOTE — Consult Note (Signed)
PHARMACY CONSULT NOTE - FOLLOW UP  Pharmacy Consult for Electrolyte Monitoring and Replacement   Recent Labs: Potassium (mmol/L)  Date Value  12/29/2020 3.6   Calcium (mg/dL)  Date Value  12/29/2020 8.4 (L)   Albumin (g/dL)  Date Value  12/29/2020 4.0   Sodium (mmol/L)  Date Value  12/29/2020 103 (LL)     Assessment: 70 y/o male presented with N/V/D x 1 week. Found to have severely low Na of < 102, Cl < 65. Initiated on 3% NaCl infusion at 25 mL/hr. Pharmacy has been consulted to monitor and replace electrolytes as needed  12/5 1321  <102 12/5 1421  3% saline drip started 12/5 1548  <102 12/5 1826  103  Goal of Therapy:  Electrolytes WNL  Plan:  Hyponatremia -- continue 3% NaCl at 25 ml/hr per nephology. Check Na q4h Recheck BMP, Mag, Phos with AM labs   Noralee Space ,PharmD Clinical Pharmacist 12/29/2020 8:05 PM

## 2020-12-29 NOTE — ED Notes (Signed)
Pt placed on male purwick

## 2020-12-29 NOTE — Consult Note (Signed)
[facility]  CONSULT NOTE    Date: 12/29/2020                  Patient Name:  Trevor Meyers  MRN: [id number]  DOB: [birth year]  Age / Sex: 70 y.o., male         PCP: Pcp, No                 Service Requesting Consult: Dr. Tacy Learn                 Reason for Consult: Hyponatremia            History of Present Illness: Mr. Trevor Meyers presents to [facility] ED with nausea, vomiting, diarrhea and poor po intake for over one week. Patient is weak and confused. Required lorazepam. Patient was found to have a serum sodium of less than 102. Nephrology consulted.   Patient is agitated. Patient able to answer questions but unclear of how much he understands.   No family at bedside.   Per chart, patient is an alcoholic.    Medications: Outpatient medications: (Not in a hospital admission)   Current medications: Current Facility-Administered Medications  Medication Dose Route Frequency Provider Last Rate Last Admin   acetaminophen (TYLENOL) tablet 650 mg  650 mg Oral Q4H PRN Bradly Bienenstock, NP       docusate sodium (COLACE) capsule 100 mg  100 mg Oral BID PRN Bradly Bienenstock, NP       folic acid (FOLVITE) tablet 1 mg  1 mg Oral Daily Darel Hong D, NP       heparin injection 5,000 Units  5,000 Units Subcutaneous Q8H Darel Hong D, NP       LORazepam (ATIVAN) tablet 1-4 mg  1-4 mg Oral Q1H PRN Bradly Bienenstock, NP       Or   LORazepam (ATIVAN) injection 1-4 mg  1-4 mg Intravenous Q1H PRN Darel Hong D, NP   2 mg at 12/29/20 1411   multivitamin with minerals tablet 1 tablet  1 tablet Oral Daily Darel Hong D, NP       ondansetron [facility]) injection 4 mg  4 mg Intravenous Q6H PRN Bradly Bienenstock, NP       polyethylene glycol (MIRALAX / GLYCOLAX) packet 17 g  17 g Oral Daily PRN Darel Hong D, NP       sodium chloride (hypertonic) 3 % solution   Intravenous Continuous Blake Divine, MD 25 mL/hr at 12/29/20 1421 New Bag at 12/29/20 1421    thiamine tablet 100 mg  100 mg Oral Daily Darel Hong D, NP       Or   thiamine (B-1) injection 100 mg  100 mg Intravenous Daily Bradly Bienenstock, NP       No current outpatient medications on file.      Allergies: Not on File    Past Medical History: Past Medical History:  Diagnosis Date   Alcohol abuse    Diabetes ([place])    Hypertension    Hyponatremia      Past Surgical History: History reviewed. No pertinent surgical history.   Family History: No family history on file.   Social History: Social History   Socioeconomic History   Marital status: Single    Spouse name: Not on file   Number of children: Not on file   Years of education: Not on file   Highest education level: Not on file  Occupational History   Not  on file  Tobacco Use   Smoking status: Not on file   Smokeless tobacco: Not on file  Substance and Sexual Activity   Alcohol use: Not on file   Drug use: Not on file   Sexual activity: Not on file  Other Topics Concern   Not on file  Social History Narrative   Not on file   Social Determinants of Health   Financial Resource Strain: Not on file  Food Insecurity: Not on file  Transportation Needs: Not on file  Physical Activity: Not on file  Stress: Not on file  Social Connections: Not on file  Intimate Partner Violence: Not on file     Review of Systems: Review of Systems  Unable to perform ROS: Mental status change   Vital Signs: Blood pressure (!) 155/77, pulse 76, temperature 98.4 F (36.9 C), temperature source Oral, resp. rate 19, height 6\' 1"  (1.854 m), weight 93 kg, SpO2 97 %.  Weight trends: Filed Weights   12/29/20 1130  Weight: 93 kg    Physical Exam: General: Ill apearing  Head: Normocephalic, atraumatic. Moist oral mucosal membranes  Eyes: Anicteric, PERRL  Neck: Supple, trachea midline  Lungs:  Clear to auscultation  Heart: Regular rate and rhythm  Abdomen:  Soft, nontender  Extremities:  no peripheral  edema.  Neurologic: Able to answer questions, moving all four extremities  Skin: +facial flushing         Lab results: Basic Metabolic Panel: Recent Labs  Lab 12/29/20 1132 12/29/20 1321  NA <102* <102*  K 3.6  --   CL <65*  --   CO2 26  --   GLUCOSE 156*  --   BUN 16  --   CREATININE 0.94  --   CALCIUM 8.4*  --     Liver Function Tests: Recent Labs  Lab 12/29/20 1132  AST 108*  ALT 41  ALKPHOS 48  BILITOT 1.7*  PROT 7.0  ALBUMIN 4.0   Recent Labs  Lab 12/29/20 1132  LIPASE 46   No results for input(s): AMMONIA in the last 168 hours.  CBC: Recent Labs  Lab 12/29/20 1132  WBC 18.2*  NEUTROABS 16.1*  HGB 15.1  HCT RESULTS UNAVAILABLE DUE TO INTERFERING SUBSTANCE  MCV RESULTS UNAVAILABLE DUE TO INTERFERING SUBSTANCE  PLT 126*    Cardiac Enzymes: No results for input(s): CKTOTAL, CKMB, CKMBINDEX, TROPONINI in the last 168 hours.  BNP: Invalid input(s): POCBNP  CBG: No results for input(s): GLUCAP in the last 168 hours.  Microbiology: Results for orders placed or performed during the hospital encounter of 12/29/20  Resp Panel by RT-PCR (Flu A&B, Covid) Nasopharyngeal Swab     Status: None   Collection Time: 12/29/20 11:46 AM   Specimen: Nasopharyngeal Swab; Nasopharyngeal(NP) swabs in vial transport medium  Result Value Ref Range Status   SARS Coronavirus 2 by RT PCR NEGATIVE NEGATIVE Final    Comment: (NOTE) SARS-CoV-2 target nucleic acids are NOT DETECTED.  The SARS-CoV-2 RNA is generally detectable in upper respiratory specimens during the acute phase of infection. The lowest concentration of SARS-CoV-2 viral copies this assay can detect is 138 copies/mL. A negative result does not preclude SARS-Cov-2 infection and should not be used as the sole basis for treatment or other patient management decisions. A negative result may occur with  improper specimen collection/handling, submission of specimen other than nasopharyngeal swab, presence of  viral mutation(s) within the areas targeted by this assay, and inadequate number of viral copies(<138 copies/mL). A negative  result must be combined with clinical observations, patient history, and epidemiological information. The expected result is Negative.  Fact Sheet for Patients:  EntrepreneurPulse.com.au  Fact Sheet for Healthcare Providers:  IncredibleEmployment.be  This test is no t yet approved or cleared by the [place] FDA and  has been authorized for detection and/or diagnosis of SARS-CoV-2 by FDA under an Emergency Use Authorization (EUA). This EUA will remain  in effect (meaning this test can be used) for the duration of the COVID-19 declaration under Section 564(b)(1) of the Act, 21 U.S.C.section 360bbb-3(b)(1), unless the authorization is terminated  or revoked sooner.       Influenza A by PCR NEGATIVE NEGATIVE Final   Influenza B by PCR NEGATIVE NEGATIVE Final    Comment: (NOTE) The Xpert Xpress SARS-CoV-2/FLU/RSV plus assay is intended as an aid in the diagnosis of influenza from Nasopharyngeal swab specimens and should not be used as a sole basis for treatment. Nasal washings and aspirates are unacceptable for Xpert Xpress SARS-CoV-2/FLU/RSV testing.  Fact Sheet for Patients: EntrepreneurPulse.com.au  Fact Sheet for Healthcare Providers: IncredibleEmployment.be  This test is not yet approved or cleared by the [place] FDA and has been authorized for detection and/or diagnosis of SARS-CoV-2 by FDA under an Emergency Use Authorization (EUA). This EUA will remain in effect (meaning this test can be used) for the duration of the COVID-19 declaration under Section 564(b)(1) of the Act, 21 U.S.C. section 360bbb-3(b)(1), unless the authorization is terminated or revoked.  Performed at [facility], [address] [place], [place] [address]     Coagulation  Studies: No results for input(s): LABPROT, INR in the last 72 hours.  Urinalysis: No results for input(s): COLORURINE, LABSPEC, PHURINE, GLUCOSEU, HGBUR, BILIRUBINUR, KETONESUR, PROTEINUR, UROBILINOGEN, NITRITE, LEUKOCYTESUR in the last 72 hours.  Invalid input(s): APPERANCEUR    Imaging: DG Chest 2 View  Result Date: 12/29/2020 CLINICAL DATA:  Altered mental status EXAM: CHEST - 2 VIEW COMPARISON:  None. FINDINGS: Lateral view degraded by patient arm position. Midline trachea. Normal heart size and mediastinal contours. No pleural effusion or pneumothorax. Clear lungs. IMPRESSION: No active cardiopulmonary disease. Electronically Signed   By: Abigail Miyamoto M.D.   On: 12/29/2020 12:33   CT Head Wo Contrast  Result Date: 12/29/2020 CLINICAL DATA:  70 year old male with history of mental status change. Weakness. EXAM: CT HEAD WITHOUT CONTRAST CT CERVICAL SPINE WITHOUT CONTRAST TECHNIQUE: Multidetector CT imaging of the head and cervical spine was performed following the standard protocol without intravenous contrast. Multiplanar CT image reconstructions of the cervical spine were also generated. COMPARISON:  No priors. FINDINGS: CT HEAD FINDINGS Brain: Mild cerebral atrophy. Patchy and confluent areas of decreased attenuation are noted throughout the deep and periventricular white matter of the cerebral hemispheres bilaterally, compatible with chronic microvascular ischemic disease. No evidence of acute infarction, hemorrhage, hydrocephalus, extra-axial collection or mass lesion/mass effect. Vascular: No hyperdense vessel or unexpected calcification. Skull: Normal. Negative for fracture or focal lesion. Sinuses/Orbits: No acute finding. Other: None. CT CERVICAL SPINE FINDINGS Alignment: Normal. Skull base and vertebrae: No acute fracture. No primary bone lesion or focal pathologic process. Soft tissues and spinal canal: No prevertebral fluid or swelling. No visible canal hematoma. Disc levels:  Multilevel degenerative disc disease, most severe at C5-C6 and C6-C7. Mild multilevel facet arthropathy. Upper chest: Negative. Other: None. IMPRESSION: 1. No acute intracranial abnormalities. 2. No acute abnormality of the cervical spine. 3. Mild cerebral atrophy with chronic microvascular ischemic changes in the cerebral white matter, as  above. 4. Mild multilevel degenerative disc disease and cervical spondylosis, as above. Electronically Signed   By: Vinnie Langton M.D.   On: 12/29/2020 12:34   CT Cervical Spine Wo Contrast  Result Date: 12/29/2020 CLINICAL DATA:  70 year old male with history of mental status change. Weakness. EXAM: CT HEAD WITHOUT CONTRAST CT CERVICAL SPINE WITHOUT CONTRAST TECHNIQUE: Multidetector CT imaging of the head and cervical spine was performed following the standard protocol without intravenous contrast. Multiplanar CT image reconstructions of the cervical spine were also generated. COMPARISON:  No priors. FINDINGS: CT HEAD FINDINGS Brain: Mild cerebral atrophy. Patchy and confluent areas of decreased attenuation are noted throughout the deep and periventricular white matter of the cerebral hemispheres bilaterally, compatible with chronic microvascular ischemic disease. No evidence of acute infarction, hemorrhage, hydrocephalus, extra-axial collection or mass lesion/mass effect. Vascular: No hyperdense vessel or unexpected calcification. Skull: Normal. Negative for fracture or focal lesion. Sinuses/Orbits: No acute finding. Other: None. CT CERVICAL SPINE FINDINGS Alignment: Normal. Skull base and vertebrae: No acute fracture. No primary bone lesion or focal pathologic process. Soft tissues and spinal canal: No prevertebral fluid or swelling. No visible canal hematoma. Disc levels: Multilevel degenerative disc disease, most severe at C5-C6 and C6-C7. Mild multilevel facet arthropathy. Upper chest: Negative. Other: None. IMPRESSION: 1. No acute intracranial abnormalities. 2. No  acute abnormality of the cervical spine. 3. Mild cerebral atrophy with chronic microvascular ischemic changes in the cerebral white matter, as above. 4. Mild multilevel degenerative disc disease and cervical spondylosis, as above. Electronically Signed   By: Vinnie Langton M.D.   On: 12/29/2020 12:34     Assessment & Plan: Mr. Jasiyah Paulding is a 70 y.o. white male with hypertension, alcohol abuse, diabetes mellitus type II, neuropathy, anxiety, depression, history of rectal cancer, who was admitted to [facility] on 12/29/2020 for Hyponatremia [E87.1] Sodium is critically low. However due to patient's mentation, suspect this is a chronic issue.  - Hypertonic saline - neuro checks - serial sodium checks - Patient will need ICU admission.   Patient without any contacts on file to notify about his critical illness.    LOS: 0 Kevon Tench 12/5/20222:25 PM

## 2020-12-29 NOTE — Progress Notes (Signed)
[facility] Progress Note Patient Name: Angas Isabell DOB: 26-Apr-1950 MRN: [id number]   Date of Service  12/29/2020  HPI/Events of Note  Patient is an alcoholic admitted with severe hyponatremia and altered mental status, now also going into ETOH withdrawal.  eICU Interventions  New Patient Evaluation.        Kerry Kass Saint Hank 12/29/2020, 9:04 PM

## 2020-12-29 NOTE — Consult Note (Signed)
MEDICATION RELATED CONSULT NOTE - INITIAL   Pharmacy Consult for 3% NaCl monitoring Indication: hyponatremia  Not on File  Patient Measurements: Height: 6\' 1"  (185.4 cm) Weight: 93 kg (205 lb) IBW/kg (Calculated) : 79.9  Vital Signs: Temp: 98.4 F (36.9 C) (12/05 1125) Temp Source: Oral (12/05 1125) BP: 155/77 (12/05 1300) Pulse Rate: 76 (12/05 1300) Intake/Output from previous day: No intake/output data recorded. Intake/Output from this shift: No intake/output data recorded.  Labs: Recent Labs    12/29/20 1132  WBC 18.2*  HGB 15.1  HCT RESULTS UNAVAILABLE DUE TO INTERFERING SUBSTANCE  PLT 126*  CREATININE 0.94  ALBUMIN 4.0  PROT 7.0  AST 108*  ALT 41  ALKPHOS 48  BILITOT 1.7*   Estimated Creatinine Clearance: 83.8 mL/min (by C-G formula based on SCr of 0.94 mg/dL).   Microbiology: Recent Results (from the past 720 hour(s))  Resp Panel by RT-PCR (Flu A&B, Covid) Nasopharyngeal Swab     Status: None   Collection Time: 12/29/20 11:46 AM   Specimen: Nasopharyngeal Swab; Nasopharyngeal(NP) swabs in vial transport medium  Result Value Ref Range Status   SARS Coronavirus 2 by RT PCR NEGATIVE NEGATIVE Final    Comment: (NOTE) SARS-CoV-2 target nucleic acids are NOT DETECTED.  The SARS-CoV-2 RNA is generally detectable in upper respiratory specimens during the acute phase of infection. The lowest concentration of SARS-CoV-2 viral copies this assay can detect is 138 copies/mL. A negative result does not preclude SARS-Cov-2 infection and should not be used as the sole basis for treatment or other patient management decisions. A negative result may occur with  improper specimen collection/handling, submission of specimen other than nasopharyngeal swab, presence of viral mutation(s) within the areas targeted by this assay, and inadequate number of viral copies(<138 copies/mL). A negative result must be combined with clinical observations, patient history, and  epidemiological information. The expected result is Negative.  Fact Sheet for Patients:  EntrepreneurPulse.com.au  Fact Sheet for Healthcare Providers:  IncredibleEmployment.be  This test is no t yet approved or cleared by the [place] FDA and  has been authorized for detection and/or diagnosis of SARS-CoV-2 by FDA under an Emergency Use Authorization (EUA). This EUA will remain  in effect (meaning this test can be used) for the duration of the COVID-19 declaration under Section 564(b)(1) of the Act, 21 U.S.C.section 360bbb-3(b)(1), unless the authorization is terminated  or revoked sooner.       Influenza A by PCR NEGATIVE NEGATIVE Final   Influenza B by PCR NEGATIVE NEGATIVE Final    Comment: (NOTE) The Xpert Xpress SARS-CoV-2/FLU/RSV plus assay is intended as an aid in the diagnosis of influenza from Nasopharyngeal swab specimens and should not be used as a sole basis for treatment. Nasal washings and aspirates are unacceptable for Xpert Xpress SARS-CoV-2/FLU/RSV testing.  Fact Sheet for Patients: EntrepreneurPulse.com.au  Fact Sheet for Healthcare Providers: IncredibleEmployment.be  This test is not yet approved or cleared by the [place] FDA and has been authorized for detection and/or diagnosis of SARS-CoV-2 by FDA under an Emergency Use Authorization (EUA). This EUA will remain in effect (meaning this test can be used) for the duration of the COVID-19 declaration under Section 564(b)(1) of the Act, 21 U.S.C. section 360bbb-3(b)(1), unless the authorization is terminated or revoked.  Performed at [facility], [address] [place], [place] [address]     Medical History: No past medical history on file.  Medications:  500 mL NS bolus x 1 given by EMS  12/5 1330  3% NaCl @ 25 mL/hr  Assessment: 70 y/o male presented with N/V/D x 1 week. Found to have severely low Na  of < 102, Cl < 65. Initiated on 3% NaCl infusion at 25 mL/hr. Pharmacy has been consulted for monitoring   Goal of Therapy:  Correction of hyponatremia Avoid Na increases > 4 mEq/L in 2 hours or > 6 mEq/L in 4 hours   Plan:  Continue NaCl 3% at 25 mL/hr as ordered by MD Na level Q2h x 2 then Q4H Stop infusion and notify MD if  Na increases > 4 mEq/L in 2 hours or > 6 mEq/L in 4 hours   Dorothe Pea, PharmD, BCPS Clinical Pharmacist   12/29/2020,1:22 PM

## 2020-12-29 NOTE — ED Triage Notes (Signed)
Pt arrived via ems from home. Ems reports n/v/d x 8 days. Reports not nauseous at this time last emesis was 0500, weakness, and disoriented. Unable to follow some commands and carry out completely. Able to name, dob, address, correctly with ems.   18g left ac, given 550ml NS by ems.   192/83 75 HR 99% 178 cbg

## 2020-12-29 NOTE — ED Notes (Addendum)
Pt transferred to hospital bed

## 2020-12-30 DIAGNOSIS — E871 Hypo-osmolality and hyponatremia: Secondary | ICD-10-CM | POA: Diagnosis not present

## 2020-12-30 DIAGNOSIS — E876 Hypokalemia: Secondary | ICD-10-CM

## 2020-12-30 DIAGNOSIS — F10931 Alcohol use, unspecified with withdrawal delirium: Secondary | ICD-10-CM | POA: Diagnosis not present

## 2020-12-30 LAB — PHOSPHORUS: Phosphorus: 3 mg/dL (ref 2.5–4.6)

## 2020-12-30 LAB — CBC
Hemoglobin: 9.4 g/dL — ABNORMAL LOW (ref 13.0–17.0)
Platelets: 105 10*3/uL — ABNORMAL LOW (ref 150–400)
WBC: 7.7 10*3/uL (ref 4.0–10.5)
nRBC: 0 % (ref 0.0–0.2)

## 2020-12-30 LAB — BASIC METABOLIC PANEL
Anion gap: 7 (ref 5–15)
BUN: 17 mg/dL (ref 8–23)
CO2: 27 mmol/L (ref 22–32)
Calcium: 8 mg/dL — ABNORMAL LOW (ref 8.9–10.3)
Chloride: 75 mmol/L — ABNORMAL LOW (ref 98–111)
Creatinine, Ser: 0.84 mg/dL (ref 0.61–1.24)
GFR, Estimated: >60 mL/min (ref >60)
Glucose, Bld: 113 mg/dL — ABNORMAL HIGH (ref 70–99)
Potassium: 2.9 mmol/L — ABNORMAL LOW (ref 3.5–5.1)
Sodium: 109 mmol/L — CL (ref 135–145)

## 2020-12-30 LAB — MAGNESIUM: Magnesium: 2.3 mg/dL (ref 1.7–2.4)

## 2020-12-30 LAB — GLUCOSE, CAPILLARY: Glucose-Capillary: 98 mg/dL (ref 70–99)

## 2020-12-30 LAB — SODIUM
Sodium: 110 mmol/L — CL (ref 135–145)
Sodium: 115 mmol/L — CL (ref 135–145)
Sodium: 114 mmol/L — CL (ref 135–145)
Sodium: 118 mmol/L — CL (ref 135–145)

## 2020-12-30 LAB — HIV ANTIBODY (ROUTINE TESTING W REFLEX): HIV Screen 4th Generation wRfx: NONREACTIVE

## 2020-12-30 LAB — POTASSIUM: Potassium: 3.7 mmol/L (ref 3.5–5.1)

## 2020-12-30 MED ORDER — POTASSIUM CHLORIDE CRYS ER 20 MEQ PO TBCR: 40.0000 meq | EXTENDED_RELEASE_TABLET | Freq: Once | ORAL | Status: DC

## 2020-12-30 MED ORDER — POTASSIUM CHLORIDE 10 MEQ/100ML IV SOLN
10.0000 meq | INTRAVENOUS | Status: AC
  Administered 2020-12-30 (×8): 10 meq via INTRAVENOUS
  Filled 2020-12-30 (×8): qty 100

## 2020-12-30 MED ORDER — POTASSIUM CHLORIDE 10 MEQ/100ML IV SOLN: 10.0000 meq | INTRAVENOUS | Status: DC

## 2020-12-30 MED ORDER — PANTOPRAZOLE SODIUM 40 MG IV SOLR
40.0000 mg | Freq: Two times a day (BID) | INTRAVENOUS | Status: DC
Start: 2020-12-30 — End: 2020-12-31
  Administered 2020-12-30 – 2020-12-31 (×3): 40 mg via INTRAVENOUS
  Filled 2020-12-30 (×3): qty 40

## 2020-12-30 NOTE — Progress Notes (Addendum)
1:1 sitter continued History of alcohol withdrawal (combative and aggressive towards staff). and correcting sodium.

## 2020-12-30 NOTE — Consult Note (Signed)
[facility] [facility] for Electrolyte Monitoring and Replacement   Recent Labs: Potassium (mmol/L)  Date Value  12/30/2020 2.9 (L)   Magnesium (mg/dL)  Date Value  12/30/2020 2.3   Calcium (mg/dL)  Date Value  12/30/2020 8.0 (L)   Albumin (g/dL)  Date Value  12/29/2020 4.0   Phosphorus (mg/dL)  Date Value  12/30/2020 3.0   Sodium (mmol/L)  Date Value  12/30/2020 115 (LL)   Assessment: 70 y/o male presented with N/V/D x 1 week. Found to have severely low Na of < 102, Cl < 65. Initiated on 3% NaCl infusion at 25 mL/hr. Pharmacy has been consulted to monitor and replace electrolytes as needed  12/5 1321  <102 12/5 1421  3% NaCl started at 25 cc/hr 12/5 1548  <102 12/5 1826  103 12/5 2143  105 12/6 0420  109  12/6 0834 110 12/6 0849 3% NaCl rate decreased to 15 cc/hr 12/6 1208 115  Goal of Therapy:  Electrolytes within normal limits No increase in Na in a 2 hr period > 4 mEq/L No increase in Na in a 4 hr period > 6 mEq/L No increase in Na in a 24 hr period > 8 mEq/L   Plan:  --Hypertonic saline placed on hold given rapid rise in sodium --Continue to monitor Na q4h; follow-up for re-initiation of hypertonic saline versus therapeutic re-lowering with free water  Benita Gutter 12/30/2020 1:44 PM

## 2020-12-30 NOTE — Progress Notes (Signed)
[facility] ADULT ICU REPLACEMENT PROTOCOL   The patient does apply for the [facility] Adult ICU Electrolyte Replacment Protocol based on the criteria listed below:   1.Exclusion criteria: TCTS patients, ECMO patients, and Dialysis patients 2. Is GFR >/= 30 ml/min? Yes.    Patient's GFR today is >60 3. Is SCr </= 2? Yes.   Patient's SCr is 0.84 mg/dL 4. Did SCr increase >/= 0.5 in 24 hours? No. 5.Pt's weight >40kg  Yes.   6. Abnormal electrolyte(s): K 2.9  7. Electrolytes replaced per protocol  Christeen Douglas 12/30/2020 6:11 AM

## 2020-12-30 NOTE — Progress Notes (Signed)
Initial Nutrition Assessment  DOCUMENTATION CODES:   Not applicable  INTERVENTION:   RD will add supplements once pt's diet is advanced  If nasogastric tube placed, recommend:   Osmolite 1.5@70ml /hr- Initiate at 52ml/hr and increase by 63ml/hr q 8 hours until goal rate is reached.  Pro-Source 72ml BID via tube, provides 40kcal and 11g of protein per serving   Free water flushes 17ml q4 hours to maintain tube patency   Regimen provides 2600kcal/day, 127g/day protein and 1420ml/day free water  Pt at high refeed risk; recommend monitor potassium, magnesium and phosphorus labs daily until stable  NUTRITION DIAGNOSIS:   Inadequate oral intake related to acute illness as evidenced by NPO status.  GOAL:   Patient will meet greater than or equal to 90% of their needs  MONITOR:   Diet advancement, Labs, Weight trends, Skin, I & O's  REASON FOR ASSESSMENT:   Malnutrition Screening Tool    ASSESSMENT:   70 y/o male with h/o DM, etoh abuse, chronic hyponatremia, HTN, rectal cancer, COPD, anxiety and depression who is admitted with hyponatremia and AMS.  Visited pt's room today. Pt is unable to provide any nutrition related history r/t AMS. Per chart review, pt with nausea, vomiting and diarrhea for 8 days pta. RD suspects pt with decreased oral intake at baseline r/t etoh abuse. Pt currently NPO secondary to AMS. Pt is more awake and alert today than yesterday per RN report. RD will monitor for diet advancement and add supplements when appropriate. If pt is unable to advance his diet in the next 24-48 hours, would recommend nasogastric tube and nutrition support. Pt is at high refeed risk. Per chart, pt appears weight stable pta.   Medications reviewed and include: folic acid, MVI, heparin, protonix, thiamine, precedex   Labs reviewed: Na 110(L), K 2.9(L), P 3.0 wnl, Mg 2.3 wnl Hgb 9.4(L) Cbgs- 113, 156 x 24 hrs  NUTRITION - FOCUSED PHYSICAL EXAM:  Flowsheet Row Most Recent  Value  Orbital Region No depletion  Upper Arm Region Moderate depletion  Thoracic and Lumbar Region No depletion  Buccal Region No depletion  Temple Region No depletion  Clavicle Bone Region Moderate depletion  Clavicle and Acromion Bone Region Moderate depletion  Scapular Bone Region No depletion  Dorsal Hand Unable to assess  Patellar Region Mild depletion  Anterior Thigh Region Mild depletion  Posterior Calf Region Mild depletion  Edema (RD Assessment) None  Hair Reviewed  Eyes Reviewed  Mouth Reviewed  Skin Reviewed  Nails Reviewed   Diet Order:   Diet Order     None      EDUCATION NEEDS:   Not appropriate for education at this time  Skin:  Skin Assessment: Reviewed RN Assessment (ecchymosis)  Last BM:  12/4  Height:   Ht Readings from Last 1 Encounters:  12/29/20 6\' 2"  (1.88 m)    Weight:   Wt Readings from Last 1 Encounters:  12/30/20 101.2 kg    Ideal Body Weight:  86.3 kg  BMI:  Body mass index is 28.65 kg/m.  Estimated Nutritional Needs:   Kcal:  2500-2800kcal/day  Protein:  125-140g/day  Fluid:  2.0L/day  Koleen Distance MS, RD, LDN Please refer to [facility] for RD and/or RD on-call/weekend/after hours pager

## 2020-12-30 NOTE — Plan of Care (Signed)
  Problem: Clinical Measurements: Goal: Will remain free from infection Outcome: Progressing Goal: Diagnostic test results will improve Outcome: Progressing Goal: Respiratory complications will improve Outcome: Progressing Goal: Cardiovascular complication will be avoided Outcome: Progressing   Problem: Education: Goal: Knowledge of General Education information will improve Description: Including pain rating scale, medication(s)/side effects and non-pharmacologic comfort measures Outcome: Not Progressing   Problem: Health Behavior/Discharge Planning: Goal: Ability to manage health-related needs will improve Outcome: Not Progressing   Problem: Clinical Measurements: Goal: Ability to maintain clinical measurements within normal limits will improve Outcome: Not Progressing

## 2020-12-30 NOTE — Consult Note (Signed)
[place] for Electrolyte Monitoring and Replacement   Recent Labs: Potassium (mmol/L)  Date Value  12/30/2020 3.7   Magnesium (mg/dL)  Date Value  12/30/2020 2.3   Calcium (mg/dL)  Date Value  12/30/2020 8.0 (L)   Albumin (g/dL)  Date Value  12/29/2020 4.0   Phosphorus (mg/dL)  Date Value  12/30/2020 3.0   Sodium (mmol/L)  Date Value  12/30/2020 118 (LL)   Assessment: 70 y/o male presented with N/V/D x 1 week. Found to have severely low Na of < 102, Cl < 65. Initiated on 3% NaCl infusion at 25 mL/hr. Pharmacy has been consulted to monitor and replace electrolytes as needed  12/5 1321  <102 12/5 1421  3% NaCl started at 25 cc/hr 12/5 1548  <102 12/5 1826  103 12/5 2143  105 12/6 0420  109  12/6 0834 110 12/6 0849 3% NaCl rate decreased to 15 cc/hr 12/6 1208 115 12/6 1306  3% NaCl d/c'd  12/6 1604  114 12/6 2220  118   Goal of Therapy:  Electrolytes within normal limits No increase in Na in a 2 hr period > 4 mEq/L No increase in Na in a 4 hr period > 6 mEq/L No increase in Na in a 24 hr period > 8 mEq/L   Plan:  --Hypertonic saline placed on hold given rapid rise in sodium --Continue to monitor Na q4h; follow-up for re-initiation of hypertonic saline versus therapeutic re-lowering with free water  Brason Berthelot D 12/30/2020 11:03 PM

## 2020-12-30 NOTE — TOC Initial Note (Signed)
Transition of Care [facility]) - Initial/Assessment Note    Patient Details  Name: Trevor Meyers MRN: [id number] Date of Birth: Jun 10, 1950  [facility] CM/SW Contact:    Shelbie Hutching, RN Phone Number: 12/30/2020, 12:34 PM  Clinical Narrative:                 Patient admitted to the ICU for hyponatremia.  Patient is going through alcohol withdrawal requiring Precidex infusion.  TOC consult for substance abuse resources.  RNCM was able to meet with patient at the bedside.  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  He was able to states his name, that he was a [facility] [place] and he knew that he called EMS to bring him to the hospital.  He reports that he lives alone in [place] and that he drives.  He could not give any emergency contact information and no contacts listed.    TOC will cont to follow and visit with patient once he is more alert.   Expected Discharge Plan: Home/Self Care Barriers to Discharge: Continued Medical Work up   Patient Goals and CMS Choice Patient states their goals for this hospitalization and ongoing recovery are:: patient unable to state at this time- alcohol withdrawal      Expected Discharge Plan and Services Expected Discharge Plan: Home/Self Care   Discharge Planning Services: CM Consult   Living arrangements for the past 2 months: Single Family Home                 DME Arranged: N/A DME Agency: NA       HH Arranged: NA HH Agency: NA        Prior Living Arrangements/Services Living arrangements for the past 2 months: [place] Lives with:: Self Patient language and need for interpreter reviewed:: Yes        Need for Family Participation in Patient Care: Yes (Comment) Care giver support system in place?: No (comment)   Criminal Activity/Legal Involvement Pertinent to Current Situation/Hospitalization: No - Comment as needed  Activities  of Daily Living Home Assistive Devices/Equipment: Walker (specify type) ADL Screening (condition at time of admission) Patient's cognitive ability adequate to safely complete daily activities?: Yes Is the patient deaf or have difficulty hearing?: Yes Does the patient have difficulty seeing, even when wearing glasses/contacts?: Yes Does the patient have difficulty concentrating, remembering, or making decisions?: Yes Patient able to express need for assistance with ADLs?: Yes Does the patient have difficulty dressing or bathing?: No Independently performs ADLs?: Yes (appropriate for developmental age) Does the patient have difficulty walking or climbing stairs?: Yes Weakness of Legs: None Weakness of Arms/Hands: None  Permission Sought/Granted   Permission granted to share information with : No              Emotional Assessment Appearance:: Appears stated age Attitude/Demeanor/Rapport: Lethargic Affect (typically observed): Calm, Other (comment) Orientation: : Oriented to Self, Oriented to Place, Oriented to Situation Alcohol / Substance Use: Alcohol Use Psych Involvement: No (comment)  Admission diagnosis:  Alcohol abuse [F10.10] Hyponatremia [E87.1] Altered mental status, unspecified altered mental status type [R41.82] Patient Active Problem List   Diagnosis Date Noted   Hyponatremia 12/29/2020   PCP:  Merryl Hacker, No Pharmacy:   [facility] - [place], [address] [place] [address] Phone: [phone] Fax: [phone]  Social Determinants of Health (SDOH) Interventions    Readmission Risk Interventions No flowsheet data found.   

## 2020-12-30 NOTE — Progress Notes (Signed)
NAME:  Trevor Meyers, MRN:  [id number], DOB:  Mar 01, 1950, LOS: 1 ADMISSION DATE:  12/29/2020, CONSULTATION DATE:  12/29/20 REFERRING MD:  Dr. Charna Archer, CHIEF COMPLAINT:  AMS, generalized weakness, s/p fall, N/V/D   Brief Pt Description / Synopsis:  70 y.o. Male admitted with Acute Metabolic Encephalopathy in the setting of severe Hyponatremia (Hypotonic Hypovolemic) due to GI losses, along with developing Delirium Tremens.  History of Present Illness:  Trevor Meyers is a 70 year old male with a past medical history significant for alcohol abuse (reports 6-8 beers per day), hyponatremia requiring hospitalization, hypertension, diabetes mellitus who presented to [facility] ED on 12/29/2020 due to complaints of generalized weakness status post fall at home, nausea, vomiting, diarrhea.  Patient is currently confused and seems to be exhibiting signs of developing DT's upon my examination the ED, however no focal neuro deficits noted and patient is able to contribute to history. He reports about a 1 week history of persistent nausea, vomiting (non bloody), diarrhea (non bloody). He denies chest pain,  palpitations, dizziness, cough, shortness of breath, fever, chills, dysuria, abdominal pain, hematemesis or hematochezia.  Over the past 3 days he began to have progressive weakness and began to feel unsteady on his feet, and reported he had a fall this morning and hit his head.  Upon EMS arrival he was noted to be slightly disoriented.  EMS gave a 500 cc bolus of normal saline.  ED Course: Initial vital signs: Temperature 98.4 F orally, respiratory rate 20, pulse 79, blood pressure 190/89, SPO2 96% on room air Significant labs: Sodium < 102, chloride less than 65, glucose 156, BUN 16, creatinine 0.94, alkaline phosphatase 48, lipase 46, AST 108, ALT 41, WBC 18.2, platelets 126 (smear unremarkable), serum ethyl alcohol <10, serum osmolality 213 SARS-CoV-2 and influenza PCR negative Urine drug screen is pending Urine  osmolality and urine sodium pending Imaging: CT head and CT cervical spine>>IMPRESSION: 1. No acute intracranial abnormalities. 2. No acute abnormality of the cervical spine. 3. Mild cerebral atrophy with chronic microvascular ischemic changes in the cerebral white matter, as above. 4. Mild multilevel degenerative disc disease and cervical spondylosis, as above. Chest x-ray>>No active cardiopulmonary disease. Medications given: 3% hypertonic saline infusion at 25 mils per hour  PCCM is asked to admit the patient to ICU for further work-up and treatment of Acute Metabolic Encephalopathy in the setting of severe Hyponatremia (Hypotonic Hypovolemic) due to GI losses, along with developing Delirium Tremens.  Nephrology is consulted.   Pertinent  Medical History  Alcohol Abuse Hyponatremia Hypertension Hyperlipidemia Diabetes Mellitus  Micro Data:  12/29/2020: SARS-CoV-2 and influenza PCR>> negative 12/29/2020: GI panel by PCR>> 12/29/2020: C. Difficile>>  Antimicrobials:  N/A  Significant Hospital Events: Including procedures, antibiotic start and stop dates in addition to other pertinent events   12/29/2020: Presented to ED, found to have severe hyponatremia requiring 3% hypertonic saline.  PCCM asked to admit, nephrology consulted.  Starting to exhibit signs of developing DTs. 12/30/2020: Sedated on Precedex for DT's, calm and protecting airway.  3% saline decreased as serum Na increased 7 points in last 24 hrs.    Interim History / Subjective:  -Pt required initiation of Precedex last night for DT's -This morning is calm and sedated on Precedex ~ currently protecting his airway -Afebrile, hemodynamically stable, NO vasopressors -Serum Na increased to 109 this morning (increase of 7 points over last 24 hrs) ~ 3% Hypertonic saline decreased to 15 ml/hr -Discussed with Nephrology, in agreement with rate of hypertonic saline ~ GOAL Na+  for tomorrow AM of approximately 117 -Hypokalemic  2.9, receiving replacement via IV  Objective   Blood pressure 124/75, pulse (!) 56, temperature 98.2 F (36.8 C), temperature source Oral, resp. rate 12, height 6\' 2"  (1.88 m), weight 101.2 kg, SpO2 100 %.        Intake/Output Summary (Last 24 hours) at 12/30/2020 0914 Last data filed at 12/30/2020 2725 Gross per 24 hour  Intake 806.13 ml  Output 4350 ml  Net -3543.87 ml   Filed Weights   12/29/20 1130 12/29/20 2025 12/30/20 0403  Weight: 93 kg 101 kg 101.2 kg    Examination: General: Acutely ill-appearing male, laying in bed, sedated on Precedex, calm and in NAD HENT: Atraumatic, normocephalic, neck supple, no JVD Lungs: Clear breath sounds to auscultation bilaterally, no wheezing or rales noted, even, nonlabored Cardiovascular: Bradycardia, Regular rhythm, S1-S2, no murmurs, rubs, gallops Abdomen: Soft, nontender, nondistended, no guarding rebound tenderness, bowel sounds positive x4 Extremities: Normal bulk and tone, generalized weakness, no deformities, no edema Neuro: Sedated on Precedex, calm, withdraws and with purposeful movements from pain, pupils PERRLA (1 mm bilaterally) GU: External catheter in place Skin: Warm and dry.  No obvious rashes, lesions, ulcerations  Resolved Hospital Problem list     Assessment & Plan:   Severe Hyponatremia (Hypotonic Hypovolemic) due to GI losses over the last week Hypokalemia -Monitor I&O's / urinary output -Follow BMP -Ensure adequate renal perfusion -Avoid nephrotoxic agents as able -Replace electrolytes as indicated -Serum Osmolality 213, Urine Osmolality 384, Urine Na <10 -Urine osmolality, & urine Na+ pending -Hypertonic 3% Saline infusion ~ pharmacy following -Follow Serum Na+ q4h -Goal rate of Correction: 4-6 mEq in 24 hrs (MAX RATE OF CORRECTION: 8 mEq in 24 hrs) -Nephrology following, appreciate input ~ goal for tomorrow AM 12/31/20 is serum Na of approximately 366  Acute Metabolic Encephalopathy in the setting of  severe Hyponatremia & severe Delirium Tremens PMHx: ETOH abuse (pt reports 6-8 beers/day) -Correction of Na+ as above -CT Head negative for any acute intracranial process 12/5 -Urine drug screen is negative -Serum Ethyl alcohol  <10 -CIWA Protocol -Precedex as needed -Thiamine, Folic acid, MV supplementation -Seizure precautions -Currently protecting his airway, but HIGH RISK FOR INTUBATION -Provide supportive care  Leukocytosis, suspect due to Viral Gastroenteritis ~ RESOLVED  -Monitor fever curve -Trend WBC's & Procalcitonin -Follow cultures as above -CXR 12/29/20 without acute process -Urinalysis not consistent with UTI -Abdominal exam is benign ~ will check GI panel and C-diff panel if any further diarrhea -Will hold off on empiric antibiotics at this time pending cultures & sensitivities  Thrombocytopenia, suspect due to ETOH abuse Anemia without overt s/sx of bleeding -Monitor for S/Sx of bleeding -Trend CBC -Heparin SQ for VTE Prophylaxis  -Transfuse for Hgb <7 -Smear is normal      Best Practice (right click and "Reselect all SmartList Selections" daily)   Diet/type: NPO DVT prophylaxis: prophylactic heparin  GI prophylaxis: PPI Lines: N/A Foley:  N/A Code Status:  full code Last date of multidisciplinary goals of care discussion [12/30/2020]  Pt is critically ill due to severe hyponatremia and severe DT's.  He is high risk for seizures, intubation, cardiac arrest, and death.  No family/contact information listed/available in chart.  Labs   CBC: Recent Labs  Lab 12/29/20 1132 12/30/20 0420  WBC 18.2* 7.7  NEUTROABS 16.1*  --   HGB 15.1 9.4*  HCT RESULTS UNAVAILABLE DUE TO INTERFERING SUBSTANCE RESULTS UNAVAILABLE DUE TO INTERFERING SUBSTANCE  MCV RESULTS UNAVAILABLE DUE TO INTERFERING  SUBSTANCE RESULTS UNAVAILABLE DUE TO INTERFERING SUBSTANCE  PLT 126* 105*     Basic Metabolic Panel: Recent Labs  Lab 12/29/20 1132 12/29/20 1321 12/29/20 1548  12/29/20 1826 12/29/20 2143 12/30/20 0420  NA <102* <102* <102* 103* 105* 109*  K 3.6  --   --   --   --  2.9*  CL <65*  --   --   --   --  75*  CO2 26  --   --   --   --  27  GLUCOSE 156*  --   --   --   --  113*  BUN 16  --   --   --   --  17  CREATININE 0.94  --   --   --   --  0.84  CALCIUM 8.4*  --   --   --   --  8.0*  MG  --   --   --   --   --  2.3  PHOS  --   --   --   --   --  3.0    GFR: Estimated Creatinine Clearance: 105.4 mL/min (by C-G formula based on SCr of 0.84 mg/dL). Recent Labs  Lab 12/29/20 1132 12/30/20 0420  WBC 18.2* 7.7     Liver Function Tests: Recent Labs  Lab 12/29/20 1132  AST 108*  ALT 41  ALKPHOS 48  BILITOT 1.7*  PROT 7.0  ALBUMIN 4.0    Recent Labs  Lab 12/29/20 1132  LIPASE 46    No results for input(s): AMMONIA in the last 168 hours.  ABG No results found for: PHART, PCO2ART, PO2ART, HCO3, TCO2, ACIDBASEDEF, O2SAT   Coagulation Profile: No results for input(s): INR, PROTIME in the last 168 hours.  Cardiac Enzymes: No results for input(s): CKTOTAL, CKMB, CKMBINDEX, TROPONINI in the last 168 hours.  HbA1C: No results found for: HGBA1C  CBG: Recent Labs  Lab 12/29/20 2025  GLUCAP 126*    Review of Systems:   Unable to assess due to AMS, DT's, and Precedex infusion   Past Medical History:  He,  has a past medical history of Alcohol abuse, Diabetes ([place]), Hypertension, and Hyponatremia.   Surgical History:  History reviewed. No pertinent surgical history.   Social History:      Family History:  His family history is not on file.   Allergies Not on File   Home Medications  Prior to Admission medications   Not on File     Critical care time: 40 minutes     Darel Hong, AGACNP-BC [place] epic messenger for cross cover needs If after hours, please call E-link

## 2020-12-30 NOTE — Progress Notes (Signed)
[facility]  ROUNDING NOTE   Subjective:   Confused and agitated. Placed on CIWA and precedex  NA 109 - hypertonic infusion.   Objective:  Vital signs in last 24 hours:  Temp:  [98 F (36.7 C)-98.4 F (36.9 C)] 98.2 F (36.8 C) (12/06 0800) Pulse Rate:  [52-94] 59 (12/06 0800) Resp:  [11-22] 12 (12/06 0800) BP: (114-190)/(58-110) 143/82 (12/06 0800) SpO2:  [93 %-100 %] 100 % (12/06 0800) Weight:  [93 kg-101.2 kg] 101.2 kg (12/06 0403)  Weight change:  Filed Weights   12/29/20 1130 12/29/20 2025 12/30/20 0403  Weight: 93 kg 101 kg 101.2 kg    Intake/Output: I/O last 3 completed shifts: In: 671.2 [I.V.:472.3; IV Piggyback:198.9] Out: 7371 [Urine:4350]   Intake/Output this shift:  Total I/O In: 135 [I.V.:38.6; IV Piggyback:96.4] Out: -   Physical Exam: General: Laying in bed  Head: Normocephalic, atraumatic. Moist oral mucosal membranes  Eyes: Anicteric, PERRL  Neck: Supple, trachea midline  Lungs:  +wheezing  Heart: Regular rate and rhythm  Abdomen:  Soft, nontender,   Extremities:  no peripheral edema.  Neurologic: Sedated  Skin: No lesions        Basic Metabolic Panel: Recent Labs  Lab 12/29/20 1132 12/29/20 1321 12/29/20 1548 12/29/20 1826 12/29/20 2143 12/30/20 0420  NA <102* <102* <102* 103* 105* 109*  K 3.6  --   --   --   --  2.9*  CL <65*  --   --   --   --  75*  CO2 26  --   --   --   --  27  GLUCOSE 156*  --   --   --   --  113*  BUN 16  --   --   --   --  17  CREATININE 0.94  --   --   --   --  0.84  CALCIUM 8.4*  --   --   --   --  8.0*  MG  --   --   --   --   --  2.3  PHOS  --   --   --   --   --  3.0    Liver Function Tests: Recent Labs  Lab 12/29/20 1132  AST 108*  ALT 41  ALKPHOS 48  BILITOT 1.7*  PROT 7.0  ALBUMIN 4.0   Recent Labs  Lab 12/29/20 1132  LIPASE 46   No results for input(s): AMMONIA in the last 168 hours.  CBC: Recent Labs  Lab 12/29/20 1132 12/30/20 0420  WBC 18.2* 7.7   NEUTROABS 16.1*  --   HGB 15.1 9.4*  HCT RESULTS UNAVAILABLE DUE TO INTERFERING SUBSTANCE RESULTS UNAVAILABLE DUE TO INTERFERING SUBSTANCE  MCV RESULTS UNAVAILABLE DUE TO INTERFERING SUBSTANCE RESULTS UNAVAILABLE DUE TO INTERFERING SUBSTANCE  PLT 126* 105*    Cardiac Enzymes: No results for input(s): CKTOTAL, CKMB, CKMBINDEX, TROPONINI in the last 168 hours.  BNP: Invalid input(s): POCBNP  CBG: Recent Labs  Lab 12/29/20 2025  GLUCAP 126*    Microbiology: Results for orders placed or performed during the hospital encounter of 12/29/20  Resp Panel by RT-PCR (Flu A&B, Covid) Nasopharyngeal Swab     Status: None   Collection Time: 12/29/20 11:46 AM   Specimen: Nasopharyngeal Swab; Nasopharyngeal(NP) swabs in vial transport medium  Result Value Ref Range Status   SARS Coronavirus 2 by RT PCR NEGATIVE NEGATIVE Final    Comment: (NOTE) SARS-CoV-2 target nucleic acids are NOT DETECTED.  The SARS-CoV-2 RNA is generally detectable in upper respiratory specimens during the acute phase of infection. The lowest concentration of SARS-CoV-2 viral copies this assay can detect is 138 copies/mL. A negative result does not preclude SARS-Cov-2 infection and should not be used as the sole basis for treatment or other patient management decisions. A negative result may occur with  improper specimen collection/handling, submission of specimen other than nasopharyngeal swab, presence of viral mutation(s) within the areas targeted by this assay, and inadequate number of viral copies(<138 copies/mL). A negative result must be combined with clinical observations, patient history, and epidemiological information. The expected result is Negative.  Fact Sheet for Patients:  EntrepreneurPulse.com.au  Fact Sheet for Healthcare Providers:  IncredibleEmployment.be  This test is no t yet approved or cleared by the [place] FDA and  has been authorized for  detection and/or diagnosis of SARS-CoV-2 by FDA under an Emergency Use Authorization (EUA). This EUA will remain  in effect (meaning this test can be used) for the duration of the COVID-19 declaration under Section 564(b)(1) of the Act, 21 U.S.C.section 360bbb-3(b)(1), unless the authorization is terminated  or revoked sooner.       Influenza A by PCR NEGATIVE NEGATIVE Final   Influenza B by PCR NEGATIVE NEGATIVE Final    Comment: (NOTE) The Xpert Xpress SARS-CoV-2/FLU/RSV plus assay is intended as an aid in the diagnosis of influenza from Nasopharyngeal swab specimens and should not be used as a sole basis for treatment. Nasal washings and aspirates are unacceptable for Xpert Xpress SARS-CoV-2/FLU/RSV testing.  Fact Sheet for Patients: EntrepreneurPulse.com.au  Fact Sheet for Healthcare Providers: IncredibleEmployment.be  This test is not yet approved or cleared by the [place] FDA and has been authorized for detection and/or diagnosis of SARS-CoV-2 by FDA under an Emergency Use Authorization (EUA). This EUA will remain in effect (meaning this test can be used) for the duration of the COVID-19 declaration under Section 564(b)(1) of the Act, 21 U.S.C. section 360bbb-3(b)(1), unless the authorization is terminated or revoked.  Performed at [facility], [address] [place], [place] [address]   MRSA Next Gen by PCR, Nasal     Status: None   Collection Time: 12/29/20  8:26 PM   Specimen: Nasal Mucosa; Nasal Swab  Result Value Ref Range Status   MRSA by PCR Next Gen NOT DETECTED NOT DETECTED Final    Comment: (NOTE) The GeneXpert MRSA Assay (FDA approved for NASAL specimens only), is one component of a comprehensive MRSA colonization surveillance program. It is not intended to diagnose MRSA infection nor to guide or monitor treatment for MRSA infections. Test performance is not FDA approved in patients less than 19  years old. Performed at [facility], [address] [place], [place] [address]     Coagulation Studies: No results for input(s): LABPROT, INR in the last 72 hours.  Urinalysis: Recent Labs    12/29/20 1131  COLORURINE YELLOW*  LABSPEC 1.013  PHURINE 6.0  GLUCOSEU 150*  HGBUR MODERATE*  BILIRUBINUR NEGATIVE  KETONESUR 20*  PROTEINUR 100*  NITRITE NEGATIVE  LEUKOCYTESUR NEGATIVE      Imaging: DG Chest 2 View  Result Date: 12/29/2020 CLINICAL DATA:  Altered mental status EXAM: CHEST - 2 VIEW COMPARISON:  None. FINDINGS: Lateral view degraded by patient arm position. Midline trachea. Normal heart size and mediastinal contours. No pleural effusion or pneumothorax. Clear lungs. IMPRESSION: No active cardiopulmonary disease. Electronically Signed   By: Abigail Miyamoto M.D.   On: 12/29/2020 12:33  CT Head Wo Contrast  Result Date: 12/29/2020 CLINICAL DATA:  70 year old male with history of mental status change. Weakness. EXAM: CT HEAD WITHOUT CONTRAST CT CERVICAL SPINE WITHOUT CONTRAST TECHNIQUE: Multidetector CT imaging of the head and cervical spine was performed following the standard protocol without intravenous contrast. Multiplanar CT image reconstructions of the cervical spine were also generated. COMPARISON:  No priors. FINDINGS: CT HEAD FINDINGS Brain: Mild cerebral atrophy. Patchy and confluent areas of decreased attenuation are noted throughout the deep and periventricular white matter of the cerebral hemispheres bilaterally, compatible with chronic microvascular ischemic disease. No evidence of acute infarction, hemorrhage, hydrocephalus, extra-axial collection or mass lesion/mass effect. Vascular: No hyperdense vessel or unexpected calcification. Skull: Normal. Negative for fracture or focal lesion. Sinuses/Orbits: No acute finding. Other: None. CT CERVICAL SPINE FINDINGS Alignment: Normal. Skull base and vertebrae: No acute fracture. No primary bone lesion or focal  pathologic process. Soft tissues and spinal canal: No prevertebral fluid or swelling. No visible canal hematoma. Disc levels: Multilevel degenerative disc disease, most severe at C5-C6 and C6-C7. Mild multilevel facet arthropathy. Upper chest: Negative. Other: None. IMPRESSION: 1. No acute intracranial abnormalities. 2. No acute abnormality of the cervical spine. 3. Mild cerebral atrophy with chronic microvascular ischemic changes in the cerebral white matter, as above. 4. Mild multilevel degenerative disc disease and cervical spondylosis, as above. Electronically Signed   By: Vinnie Langton M.D.   On: 12/29/2020 12:34   CT Cervical Spine Wo Contrast  Result Date: 12/29/2020 CLINICAL DATA:  70 year old male with history of mental status change. Weakness. EXAM: CT HEAD WITHOUT CONTRAST CT CERVICAL SPINE WITHOUT CONTRAST TECHNIQUE: Multidetector CT imaging of the head and cervical spine was performed following the standard protocol without intravenous contrast. Multiplanar CT image reconstructions of the cervical spine were also generated. COMPARISON:  No priors. FINDINGS: CT HEAD FINDINGS Brain: Mild cerebral atrophy. Patchy and confluent areas of decreased attenuation are noted throughout the deep and periventricular white matter of the cerebral hemispheres bilaterally, compatible with chronic microvascular ischemic disease. No evidence of acute infarction, hemorrhage, hydrocephalus, extra-axial collection or mass lesion/mass effect. Vascular: No hyperdense vessel or unexpected calcification. Skull: Normal. Negative for fracture or focal lesion. Sinuses/Orbits: No acute finding. Other: None. CT CERVICAL SPINE FINDINGS Alignment: Normal. Skull base and vertebrae: No acute fracture. No primary bone lesion or focal pathologic process. Soft tissues and spinal canal: No prevertebral fluid or swelling. No visible canal hematoma. Disc levels: Multilevel degenerative disc disease, most severe at C5-C6 and C6-C7. Mild  multilevel facet arthropathy. Upper chest: Negative. Other: None. IMPRESSION: 1. No acute intracranial abnormalities. 2. No acute abnormality of the cervical spine. 3. Mild cerebral atrophy with chronic microvascular ischemic changes in the cerebral white matter, as above. 4. Mild multilevel degenerative disc disease and cervical spondylosis, as above. Electronically Signed   By: Vinnie Langton M.D.   On: 12/29/2020 12:34     Medications:    dexmedetomidine (PRECEDEX) IV infusion 0.3 mcg/kg/hr (12/30/20 0807)   potassium chloride 10 mEq (12/30/20 0807)   sodium chloride (hypertonic) 15 mL/hr at 12/30/20 0849   thiamine injection Stopped (12/30/20 0536)   Followed by   Derrill Memo ON 01/01/2021] thiamine injection      Chlorhexidine Gluconate Cloth  6 each Topical H0865   folic acid  1 mg Oral Daily   heparin  5,000 Units Subcutaneous Q8H   multivitamin with minerals  1 tablet Oral Daily   [START ON 01/03/2021] thiamine injection  100 mg Intravenous Daily   acetaminophen,  docusate sodium, LORazepam **OR** LORazepam, ondansetron (ZOFRAN) IV, polyethylene glycol  Assessment/ Plan:  Trevor Meyers is a 70 y.o. white male with hypertension, alcohol abuse, diabetes mellitus type II, neuropathy, anxiety, depression, history of rectal cancer, who was admitted to [facility] on 12/29/2020 for Alcohol abuse [F10.10] Hyponatremia [E87.1] Altered mental status, unspecified altered mental status type [R41.82]  Hyponatremia:  Sodium is critically low.   - Hypertonic saline: lower rate to 20mL/hr - neuro checks - serial sodium checks   LOS: 1 Argelio Granier 12/6/20228:51 AM

## 2020-12-30 NOTE — Consult Note (Signed)
PHARMACY CONSULT NOTE - FOLLOW UP  Pharmacy Consult for Electrolyte Monitoring and Replacement   Recent Labs: Potassium (mmol/L)  Date Value  12/30/2020 2.9 (L)   Magnesium (mg/dL)  Date Value  12/30/2020 2.3   Calcium (mg/dL)  Date Value  12/30/2020 8.0 (L)   Albumin (g/dL)  Date Value  12/29/2020 4.0   Phosphorus (mg/dL)  Date Value  12/30/2020 3.0   Sodium (mmol/L)  Date Value  12/30/2020 109 (LL)     Assessment: 70 y/o male presented with N/V/D x 1 week. Found to have severely low Na of < 102, Cl < 65. Initiated on 3% NaCl infusion at 25 mL/hr. Pharmacy has been consulted to monitor and replace electrolytes as needed  12/5 1321  <102 12/5 1421  3% saline drip started 12/5 1548  <102 12/5 1826  103 12/5 2143  105 12/6 0420  109   Goal of Therapy:  Electrolytes WNL No increase in Na in a 2 hr period > 4 mEq/L No increase in Na in a 4 hr period > 6 mEq/L No increase in Na in a 24 hr period > 8 mEq/L   Plan:  Hyponatremia -- continue 3% NaCl at 25 ml/hr per nephology. Check Na q4h Recheck BMP, Mag, Phos with AM labs   Elizabelle Fite D ,PharmD Clinical Pharmacist 12/30/2020 6:00 AM

## 2020-12-30 NOTE — Consult Note (Signed)
[facility] for Electrolyte Monitoring and Replacement   Recent Labs: Potassium (mmol/L)  Date Value  12/30/2020 2.9 (L)   Magnesium (mg/dL)  Date Value  12/30/2020 2.3   Calcium (mg/dL)  Date Value  12/30/2020 8.0 (L)   Albumin (g/dL)  Date Value  12/29/2020 4.0   Phosphorus (mg/dL)  Date Value  12/30/2020 3.0   Sodium (mmol/L)  Date Value  12/30/2020 109 (LL)   Assessment: 70 y/o male presented with N/V/D x 1 week. Found to have severely low Na of < 102, Cl < 65. Initiated on 3% NaCl infusion at 25 mL/hr. Pharmacy has been consulted to monitor and replace electrolytes as needed  12/5 1321  <102 12/5 1421  3% NaCl started at 25 cc/hr 12/5 1548  <102 12/5 1826  103 12/5 2143  105 12/6 0420  109  12/6 0849 3% NaCl rate decreased to 15 cc/hr  Goal of Therapy:  Electrolytes within normal limits No increase in Na in a 2 hr period > 4 mEq/L No increase in Na in a 4 hr period > 6 mEq/L No increase in Na in a 24 hr period > 8 mEq/L   Plan:  --Continue 3% NaCl at 15 cc/hr --4350 mL UOP documented yesterday; watch for overcorrection --K 2.9, Kcl 10 mEq x 8 (this will also increase sodium) --Continue to monitor Na q4h   Benita Gutter 12/30/2020 9:16 AM

## 2020-12-31 DIAGNOSIS — F10931 Alcohol use, unspecified with withdrawal delirium: Secondary | ICD-10-CM | POA: Diagnosis not present

## 2020-12-31 DIAGNOSIS — E871 Hypo-osmolality and hyponatremia: Secondary | ICD-10-CM | POA: Diagnosis not present

## 2020-12-31 DIAGNOSIS — G9341 Metabolic encephalopathy: Secondary | ICD-10-CM | POA: Diagnosis not present

## 2020-12-31 DIAGNOSIS — E876 Hypokalemia: Secondary | ICD-10-CM | POA: Diagnosis not present

## 2020-12-31 LAB — CBC
HCT: 37 % — ABNORMAL LOW (ref 39.0–52.0)
Hemoglobin: 13.6 g/dL (ref 13.0–17.0)
MCH: 31.6 pg (ref 26.0–34.0)
MCHC: 36.8 g/dL — ABNORMAL HIGH (ref 30.0–36.0)
MCV: 85.8 fL (ref 80.0–100.0)
Platelets: 115 10*3/uL — ABNORMAL LOW (ref 150–400)
RBC: 4.31 MIL/uL (ref 4.22–5.81)
RDW: 11.9 % (ref 11.5–15.5)
WBC: 8.7 10*3/uL (ref 4.0–10.5)
nRBC: 0 % (ref 0.0–0.2)

## 2020-12-31 LAB — COMPREHENSIVE METABOLIC PANEL
ALT: 42 U/L (ref 0–44)
AST: 64 U/L — ABNORMAL HIGH (ref 15–41)
Albumin: 3.4 g/dL — ABNORMAL LOW (ref 3.5–5.0)
Alkaline Phosphatase: 43 U/L (ref 38–126)
Anion gap: 8 (ref 5–15)
BUN: 18 mg/dL (ref 8–23)
CO2: 27 mmol/L (ref 22–32)
Calcium: 8.4 mg/dL — ABNORMAL LOW (ref 8.9–10.3)
Chloride: 88 mmol/L — ABNORMAL LOW (ref 98–111)
Creatinine, Ser: 0.84 mg/dL (ref 0.61–1.24)
GFR, Estimated: >60 mL/min (ref >60)
Glucose, Bld: 116 mg/dL — ABNORMAL HIGH (ref 70–99)
Potassium: 3.3 mmol/L — ABNORMAL LOW (ref 3.5–5.1)
Sodium: 123 mmol/L — ABNORMAL LOW (ref 135–145)
Total Bilirubin: 1 mg/dL (ref 0.3–1.2)
Total Protein: 5.9 g/dL — ABNORMAL LOW (ref 6.5–8.1)

## 2020-12-31 LAB — SODIUM
Sodium: 122 mmol/L — ABNORMAL LOW (ref 135–145)
Sodium: 122 mmol/L — ABNORMAL LOW (ref 135–145)
Sodium: 120 mmol/L — ABNORMAL LOW (ref 135–145)
Sodium: 119 mmol/L — CL (ref 135–145)
Sodium: 121 mmol/L — ABNORMAL LOW (ref 135–145)

## 2020-12-31 MED ORDER — PANTOPRAZOLE SODIUM 40 MG PO TBEC
40.0000 mg | DELAYED_RELEASE_TABLET | Freq: Two times a day (BID) | ORAL | Status: DC
  Administered 2020-12-31 – 2021-01-04 (×8): 40 mg via ORAL
  Filled 2020-12-31 (×8): qty 1

## 2020-12-31 MED ORDER — DESMOPRESSIN ACETATE 4 MCG/ML IJ SOLN
2.0000 ug | Freq: Once | INTRAMUSCULAR | Status: AC
  Administered 2020-12-31: 2 ug via INTRAVENOUS
  Filled 2020-12-31: qty 1

## 2020-12-31 MED ORDER — AMLODIPINE BESYLATE 5 MG PO TABS
5.0000 mg | ORAL_TABLET | Freq: Every day | ORAL | Status: DC
  Administered 2020-12-31 – 2021-01-03 (×4): 5 mg via ORAL
  Filled 2020-12-31 (×4): qty 1

## 2020-12-31 MED ORDER — ENSURE ENLIVE PO LIQD
237.0000 mL | Freq: Three times a day (TID) | ORAL | Status: DC
  Administered 2020-12-31 – 2021-01-04 (×8): 237 mL via ORAL

## 2020-12-31 MED ORDER — TAMSULOSIN HCL 0.4 MG PO CAPS
0.8000 mg | ORAL_CAPSULE | Freq: Every day | ORAL | Status: DC
  Administered 2020-12-31 – 2021-01-04 (×5): 0.8 mg via ORAL
  Filled 2020-12-31 (×5): qty 2

## 2020-12-31 MED ORDER — SODIUM CHLORIDE 1 G PO TABS
1.0000 g | ORAL_TABLET | Freq: Two times a day (BID) | ORAL | Status: DC
Start: 2020-12-31 — End: 2021-01-04
  Administered 2020-12-31 – 2021-01-04 (×8): 1 g via ORAL
  Filled 2020-12-31 (×10): qty 1

## 2020-12-31 MED ORDER — ENOXAPARIN SODIUM 40 MG/0.4ML IJ SOSY
40.0000 mg | PREFILLED_SYRINGE | Freq: Every day | INTRAMUSCULAR | Status: DC
  Administered 2020-12-31 – 2021-01-03 (×4): 40 mg via SUBCUTANEOUS
  Filled 2020-12-31 (×4): qty 0.4

## 2020-12-31 MED ORDER — LISINOPRIL 20 MG PO TABS
40.0000 mg | ORAL_TABLET | Freq: Every day | ORAL | Status: DC
  Administered 2020-12-31 – 2021-01-04 (×5): 40 mg via ORAL
  Filled 2020-12-31 (×5): qty 2

## 2020-12-31 NOTE — Progress Notes (Signed)
[facility]  ROUNDING NOTE   Subjective:   Na 122 (123) (118) (114) - hypertonic saline turned off yesterday at 1600.   DDAVP administered this morning.   Passed swallow eval this morning.   Patient is awake alert and oriented. States he was drinking 10-18 beers per day.   Objective:  Vital signs in last 24 hours:  Temp:  [98 F (36.7 C)-98.6 F (37 C)] 98.6 F (37 C) (12/07 0800) Pulse Rate:  [49-87] 66 (12/07 0800) Resp:  [10-24] 14 (12/07 0800) BP: (112-175)/(52-98) 175/73 (12/07 0800) SpO2:  [96 %-100 %] 99 % (12/07 0800) FiO2 (%):  [32 %] 32 % (12/06 1600) Weight:  [101.4 kg] 101.4 kg (12/07 0350)  Weight change: 8.413 kg Filed Weights   12/29/20 2025 12/30/20 0403 12/31/20 0350  Weight: 101 kg 101.2 kg 101.4 kg    Intake/Output: I/O last 3 completed shifts: In: 1771.8 [P.O.:50; I.V.:700.5; IV Piggyback:1021.3] Out: 9450 [Urine:9450]   Intake/Output this shift:  No intake/output data recorded.  Physical Exam: General: Laying in bed  Head: Normocephalic, atraumatic. Moist oral mucosal membranes  Eyes: Anicteric, PERRL  Neck: Supple, trachea midline  Lungs:  clear  Heart: Regular rate and rhythm  Abdomen:  Soft, nontender,   Extremities:  no peripheral edema.  Neurologic: Sedated  Skin: No lesions, +flushing        Basic Metabolic Panel: Recent Labs  Lab 12/29/20 1132 12/29/20 1321 12/30/20 0420 12/30/20 0834 12/30/20 1208 12/30/20 1604 12/30/20 2220 12/31/20 0504 12/31/20 0610  NA <102*   < > 109*   < > 115* 114* 118* 123* 122*  K 3.6  --  2.9*  --  3.7  --   --  3.3*  --   CL <65*  --  75*  --   --   --   --  88*  --   CO2 26  --  27  --   --   --   --  27  --   GLUCOSE 156*  --  113*  --   --   --   --  116*  --   BUN 16  --  17  --   --   --   --  18  --   CREATININE 0.94  --  0.84  --   --   --   --  0.84  --   CALCIUM 8.4*  --  8.0*  --   --   --   --  8.4*  --   MG  --   --  2.3  --   --   --   --   --   --   PHOS  --    --  3.0  --   --   --   --   --   --    < > = values in this interval not displayed.     Liver Function Tests: Recent Labs  Lab 12/29/20 1132 12/31/20 0504  AST 108* 64*  ALT 41 42  ALKPHOS 48 43  BILITOT 1.7* 1.0  PROT 7.0 5.9*  ALBUMIN 4.0 3.4*    Recent Labs  Lab 12/29/20 1132  LIPASE 46    No results for input(s): AMMONIA in the last 168 hours.  CBC: Recent Labs  Lab 12/29/20 1132 12/30/20 0420 12/31/20 0504  WBC 18.2* 7.7 8.7  NEUTROABS 16.1*  --   --   HGB 15.1 9.4* 13.6  HCT RESULTS  UNAVAILABLE DUE TO INTERFERING SUBSTANCE RESULTS UNAVAILABLE DUE TO INTERFERING SUBSTANCE 37.0*  MCV RESULTS UNAVAILABLE DUE TO INTERFERING SUBSTANCE RESULTS UNAVAILABLE DUE TO INTERFERING SUBSTANCE 85.8  PLT 126* 105* 115*     Cardiac Enzymes: No results for input(s): CKTOTAL, CKMB, CKMBINDEX, TROPONINI in the last 168 hours.  BNP: Invalid input(s): POCBNP  CBG: Recent Labs  Lab 12/29/20 2025 12/30/20 2338  GLUCAP 126* 98     Microbiology: Results for orders placed or performed during the hospital encounter of 12/29/20  Resp Panel by RT-PCR (Flu A&B, Covid) Nasopharyngeal Swab     Status: None   Collection Time: 12/29/20 11:46 AM   Specimen: Nasopharyngeal Swab; Nasopharyngeal(NP) swabs in vial transport medium  Result Value Ref Range Status   SARS Coronavirus 2 by RT PCR NEGATIVE NEGATIVE Final    Comment: (NOTE) SARS-CoV-2 target nucleic acids are NOT DETECTED.  The SARS-CoV-2 RNA is generally detectable in upper respiratory specimens during the acute phase of infection. The lowest concentration of SARS-CoV-2 viral copies this assay can detect is 138 copies/mL. A negative result does not preclude SARS-Cov-2 infection and should not be used as the sole basis for treatment or other patient management decisions. A negative result may occur with  improper specimen collection/handling, submission of specimen other than nasopharyngeal swab, presence of viral  mutation(s) within the areas targeted by this assay, and inadequate number of viral copies(<138 copies/mL). A negative result must be combined with clinical observations, patient history, and epidemiological information. The expected result is Negative.  Fact Sheet for Patients:  EntrepreneurPulse.com.au  Fact Sheet for Healthcare Providers:  IncredibleEmployment.be  This test is no t yet approved or cleared by the [place] FDA and  has been authorized for detection and/or diagnosis of SARS-CoV-2 by FDA under an Emergency Use Authorization (EUA). This EUA will remain  in effect (meaning this test can be used) for the duration of the COVID-19 declaration under Section 564(b)(1) of the Act, 21 U.S.C.section 360bbb-3(b)(1), unless the authorization is terminated  or revoked sooner.       Influenza A by PCR NEGATIVE NEGATIVE Final   Influenza B by PCR NEGATIVE NEGATIVE Final    Comment: (NOTE) The Xpert Xpress SARS-CoV-2/FLU/RSV plus assay is intended as an aid in the diagnosis of influenza from Nasopharyngeal swab specimens and should not be used as a sole basis for treatment. Nasal washings and aspirates are unacceptable for Xpert Xpress SARS-CoV-2/FLU/RSV testing.  Fact Sheet for Patients: EntrepreneurPulse.com.au  Fact Sheet for Healthcare Providers: IncredibleEmployment.be  This test is not yet approved or cleared by the [place] FDA and has been authorized for detection and/or diagnosis of SARS-CoV-2 by FDA under an Emergency Use Authorization (EUA). This EUA will remain in effect (meaning this test can be used) for the duration of the COVID-19 declaration under Section 564(b)(1) of the Act, 21 U.S.C. section 360bbb-3(b)(1), unless the authorization is terminated or revoked.  Performed at [facility], [address] [place], [place] [address]   MRSA Next Gen by PCR, Nasal      Status: None   Collection Time: 12/29/20  8:26 PM   Specimen: Nasal Mucosa; Nasal Swab  Result Value Ref Range Status   MRSA by PCR Next Gen NOT DETECTED NOT DETECTED Final    Comment: (NOTE) The GeneXpert MRSA Assay (FDA approved for NASAL specimens only), is one component of a comprehensive MRSA colonization surveillance program. It is not intended to diagnose MRSA infection nor to guide or monitor treatment for MRSA infections.  Test performance is not FDA approved in patients less than 49 years old. Performed at [facility], [address] [place], [place] [address]     Coagulation Studies: No results for input(s): LABPROT, INR in the last 72 hours.  Urinalysis: Recent Labs    12/29/20 1131  COLORURINE YELLOW*  LABSPEC 1.013  PHURINE 6.0  GLUCOSEU 150*  HGBUR MODERATE*  BILIRUBINUR NEGATIVE  KETONESUR 20*  PROTEINUR 100*  NITRITE NEGATIVE  LEUKOCYTESUR NEGATIVE       Imaging: DG Chest 2 View  Result Date: 12/29/2020 CLINICAL DATA:  Altered mental status EXAM: CHEST - 2 VIEW COMPARISON:  None. FINDINGS: Lateral view degraded by patient arm position. Midline trachea. Normal heart size and mediastinal contours. No pleural effusion or pneumothorax. Clear lungs. IMPRESSION: No active cardiopulmonary disease. Electronically Signed   By: Abigail Miyamoto M.D.   On: 12/29/2020 12:33   CT Head Wo Contrast  Result Date: 12/29/2020 CLINICAL DATA:  70 year old male with history of mental status change. Weakness. EXAM: CT HEAD WITHOUT CONTRAST CT CERVICAL SPINE WITHOUT CONTRAST TECHNIQUE: Multidetector CT imaging of the head and cervical spine was performed following the standard protocol without intravenous contrast. Multiplanar CT image reconstructions of the cervical spine were also generated. COMPARISON:  No priors. FINDINGS: CT HEAD FINDINGS Brain: Mild cerebral atrophy. Patchy and confluent areas of decreased attenuation are noted throughout the deep and  periventricular white matter of the cerebral hemispheres bilaterally, compatible with chronic microvascular ischemic disease. No evidence of acute infarction, hemorrhage, hydrocephalus, extra-axial collection or mass lesion/mass effect. Vascular: No hyperdense vessel or unexpected calcification. Skull: Normal. Negative for fracture or focal lesion. Sinuses/Orbits: No acute finding. Other: None. CT CERVICAL SPINE FINDINGS Alignment: Normal. Skull base and vertebrae: No acute fracture. No primary bone lesion or focal pathologic process. Soft tissues and spinal canal: No prevertebral fluid or swelling. No visible canal hematoma. Disc levels: Multilevel degenerative disc disease, most severe at C5-C6 and C6-C7. Mild multilevel facet arthropathy. Upper chest: Negative. Other: None. IMPRESSION: 1. No acute intracranial abnormalities. 2. No acute abnormality of the cervical spine. 3. Mild cerebral atrophy with chronic microvascular ischemic changes in the cerebral white matter, as above. 4. Mild multilevel degenerative disc disease and cervical spondylosis, as above. Electronically Signed   By: Vinnie Langton M.D.   On: 12/29/2020 12:34   CT Cervical Spine Wo Contrast  Result Date: 12/29/2020 CLINICAL DATA:  70 year old male with history of mental status change. Weakness. EXAM: CT HEAD WITHOUT CONTRAST CT CERVICAL SPINE WITHOUT CONTRAST TECHNIQUE: Multidetector CT imaging of the head and cervical spine was performed following the standard protocol without intravenous contrast. Multiplanar CT image reconstructions of the cervical spine were also generated. COMPARISON:  No priors. FINDINGS: CT HEAD FINDINGS Brain: Mild cerebral atrophy. Patchy and confluent areas of decreased attenuation are noted throughout the deep and periventricular white matter of the cerebral hemispheres bilaterally, compatible with chronic microvascular ischemic disease. No evidence of acute infarction, hemorrhage, hydrocephalus, extra-axial  collection or mass lesion/mass effect. Vascular: No hyperdense vessel or unexpected calcification. Skull: Normal. Negative for fracture or focal lesion. Sinuses/Orbits: No acute finding. Other: None. CT CERVICAL SPINE FINDINGS Alignment: Normal. Skull base and vertebrae: No acute fracture. No primary bone lesion or focal pathologic process. Soft tissues and spinal canal: No prevertebral fluid or swelling. No visible canal hematoma. Disc levels: Multilevel degenerative disc disease, most severe at C5-C6 and C6-C7. Mild multilevel facet arthropathy. Upper chest: Negative. Other: None. IMPRESSION: 1. No acute intracranial abnormalities. 2. No  acute abnormality of the cervical spine. 3. Mild cerebral atrophy with chronic microvascular ischemic changes in the cerebral white matter, as above. 4. Mild multilevel degenerative disc disease and cervical spondylosis, as above. Electronically Signed   By: Vinnie Langton M.D.   On: 12/29/2020 12:34     Medications:    thiamine injection Stopped (12/31/20 2957)   Followed by   Derrill Memo ON 01/01/2021] thiamine injection      amLODipine  5 mg Oral Daily   Chlorhexidine Gluconate Cloth  6 each Topical [id number]   folic acid  1 mg Oral Daily   heparin  5,000 Units Subcutaneous Q8H   lisinopril  40 mg Oral Daily   multivitamin with minerals  1 tablet Oral Daily   pantoprazole (PROTONIX) IV  40 mg Intravenous BID   tamsulosin  0.8 mg Oral Daily   [START ON 01/03/2021] thiamine injection  100 mg Intravenous Daily   acetaminophen, docusate sodium, LORazepam **OR** LORazepam, ondansetron (ZOFRAN) IV, polyethylene glycol  Assessment/ Plan:  Mr. Trevor Meyers is a 70 y.o. white male with hypertension, alcohol abuse, diabetes mellitus type II, neuropathy, anxiety, depression, history of rectal cancer, who was admitted to [facility] on 12/29/2020 for Alcohol abuse [F10.10] Hyponatremia [E87.1] Altered mental status, unspecified altered mental status type [R41.82]  Hyponatremia:   Sodium was critically low on admission secondary to poor solute diet (Beer potomania)  Now overcorrected. Status post DDAVP - neuro checks - serial sodium checks - resume regular diet.    LOS: 2 Zakyla Tonche 12/7/20229:07 AM

## 2020-12-31 NOTE — Consult Note (Addendum)
[place] for Electrolyte Monitoring and Replacement   Recent Labs: Potassium (mmol/L)  Date Value  12/31/2020 3.3 (L)   Magnesium (mg/dL)  Date Value  12/30/2020 2.3   Calcium (mg/dL)  Date Value  12/31/2020 8.4 (L)   Albumin (g/dL)  Date Value  12/31/2020 3.4 (L)   Phosphorus (mg/dL)  Date Value  12/30/2020 3.0   Sodium (mmol/L)  Date Value  12/31/2020 120 (L)   Assessment: 70 y/o male presented with N/V/D x 1 week. Found to have severely low Na of < 102, Cl < 65. Initiated on 3% NaCl infusion at 25 mL/hr. Pharmacy has been consulted to monitor and replace electrolytes as needed  12/5 1321  <102 12/5 1421  3% NaCl started at 25 cc/hr 12/5 1548  <102 12/5 1826  103 12/5 2143  105 12/6 0420  109  12/6 0834 110 12/6 0849 3% NaCl rate decreased to 15 cc/hr 12/6 1208 115 12/6 1306  3% NaCl d/c'd  12/6 1604  114 12/6 2220  118  12/7 0504  123 12/7 0610  122  12/7 0745 DDAVP 2 mcg IV x 1 12/7 1030  122 12/7 1344  120  Goal of Therapy:  Electrolytes within normal limits No increase in Na in a 2 hr period > 4 mEq/L No increase in Na in a 4 hr period > 6 mEq/L No increase in Na in a 24 hr period > 8 mEq/L   Plan:  --Sodium level stabilizing and now trending down slightly with DDAVP this AM. Hopefully sodium will start to gradually improve now no longer NPO --UOP 800 mL so far today (6250 mL on 12/6 and 4350 mL on 12/5) --Fluid restriction: None --Continue to monitor Na q4h  Benita Gutter 12/31/2020 2:28 PM

## 2020-12-31 NOTE — Consult Note (Signed)
[place] for Electrolyte Monitoring and Replacement   Recent Labs: Potassium (mmol/L)  Date Value  12/31/2020 3.3 (L)   Magnesium (mg/dL)  Date Value  12/30/2020 2.3   Calcium (mg/dL)  Date Value  12/31/2020 8.4 (L)   Albumin (g/dL)  Date Value  12/31/2020 3.4 (L)   Phosphorus (mg/dL)  Date Value  12/30/2020 3.0   Sodium (mmol/L)  Date Value  12/31/2020 122 (L)   Assessment: 70 y/o male presented with N/V/D x 1 week. Found to have severely low Na of < 102, Cl < 65. Initiated on 3% NaCl infusion at 25 mL/hr. Pharmacy has been consulted to monitor and replace electrolytes as needed  12/5 1321  <102 12/5 1421  3% NaCl started at 25 cc/hr 12/5 1548  <102 12/5 1826  103 12/5 2143  105 12/6 0420  109  12/6 0834 110 12/6 0849 3% NaCl rate decreased to 15 cc/hr 12/6 1208 115 12/6 1306  3% NaCl d/c'd  12/6 1604  114 12/6 2220  118  12/7 0504 123 12/7 0610  122   Goal of Therapy:  Electrolytes within normal limits No increase in Na in a 2 hr period > 4 mEq/L No increase in Na in a 4 hr period > 6 mEq/L No increase in Na in a 24 hr period > 8 mEq/L   Plan:  --Hypertonic saline discontinued since yesterday afternoon --Large UOP volumes, DDAVP x 1 12/7 --Continue to monitor Na q4h; follow-up for re-initiation of hypertonic saline versus therapeutic re-lowering with free water  Benita Gutter 12/31/2020 8:32 AM

## 2020-12-31 NOTE — Consult Note (Signed)
[place] for Electrolyte Monitoring and Replacement   Recent Labs: Potassium (mmol/L)  Date Value  12/31/2020 3.3 (L)   Magnesium (mg/dL)  Date Value  12/30/2020 2.3   Calcium (mg/dL)  Date Value  12/31/2020 8.4 (L)   Albumin (g/dL)  Date Value  12/31/2020 3.4 (L)   Phosphorus (mg/dL)  Date Value  12/30/2020 3.0   Sodium (mmol/L)  Date Value  12/31/2020 119 (LL)   Assessment: 70 y/o male presented with N/V/D x 1 week. Found to have severely low Na of < 102, Cl < 65. Initiated on 3% NaCl infusion at 25 mL/hr. Pharmacy has been consulted to monitor and replace electrolytes as needed  12/5 1321  <102 12/5 1421  3% NaCl started at 25 cc/hr 12/5 1548  <102 12/5 1826  103 12/5 2143  105 12/6 0420  109  12/6 0834 110 12/6 0849 3% NaCl rate decreased to 15 cc/hr 12/6 1208 115 12/6 1306  3% NaCl d/c'd  12/6 1604  114 12/6 2220  118  12/7 0504  123 12/7 0610  122  12/7 0745 DDAVP 2 mcg IV x 1 12/7 1030  122 12/7 1344  120 12/7 1741  119  Goal of Therapy:  Electrolytes within normal limits No increase in Na in a 2 hr period > 4 mEq/L No increase in Na in a 4 hr period > 6 mEq/L No increase in Na in a 24 hr period > 8 mEq/L   Plan:  --Sodium level stabilizing and now trending down slightly with DDAVP this AM. Per discussion with Nephrology, will start patient on NaCl 1g PO tablet BID. --UOP 800 mL so far today (6250 mL on 12/6 and 4350 mL on 12/5) --Fluid restriction: None --Continue to monitor Na q4h  Pearla Dubonnet 12/31/2020 6:17 PM

## 2020-12-31 NOTE — Progress Notes (Signed)
NAME:  Trevor Meyers, MRN:  [id number], DOB:  1950-07-23, LOS: 2 ADMISSION DATE:  12/29/2020, CONSULTATION DATE:  12/29/20 REFERRING MD:  Dr. Charna Archer, CHIEF COMPLAINT:  AMS, generalized weakness, s/p fall, N/V/D   Brief Pt Description / Synopsis:  70 y.o. Male admitted with Acute Metabolic Encephalopathy in the setting of severe Hyponatremia (Hypotonic Hypovolemic) due to GI losses, along with developing Delirium Tremens.  History of Present Illness:  Trevor Meyers is a 70 year old male with a past medical history significant for alcohol abuse (reports 6-8 beers per day), hyponatremia requiring hospitalization, hypertension, diabetes mellitus who presented to [facility] ED on 12/29/2020 due to complaints of generalized weakness status post fall at home, nausea, vomiting, diarrhea.  Patient is currently confused and seems to be exhibiting signs of developing DT's upon my examination the ED, however no focal neuro deficits noted and patient is able to contribute to history. He reports about a 1 week history of persistent nausea, vomiting (non bloody), diarrhea (non bloody). He denies chest pain,  palpitations, dizziness, cough, shortness of breath, fever, chills, dysuria, abdominal pain, hematemesis or hematochezia.  Over the past 3 days he began to have progressive weakness and began to feel unsteady on his feet, and reported he had a fall this morning and hit his head.  Upon EMS arrival he was noted to be slightly disoriented.  EMS gave a 500 cc bolus of normal saline.  ED Course: Initial vital signs: Temperature 98.4 F orally, respiratory rate 20, pulse 79, blood pressure 190/89, SPO2 96% on room air Significant labs: Sodium < 102, chloride less than 65, glucose 156, BUN 16, creatinine 0.94, alkaline phosphatase 48, lipase 46, AST 108, ALT 41, WBC 18.2, platelets 126 (smear unremarkable), serum ethyl alcohol <10, serum osmolality 213 SARS-CoV-2 and influenza PCR negative Urine drug screen is pending Urine  osmolality and urine sodium pending Imaging: CT head and CT cervical spine>>IMPRESSION: 1. No acute intracranial abnormalities. 2. No acute abnormality of the cervical spine. 3. Mild cerebral atrophy with chronic microvascular ischemic changes in the cerebral white matter, as above. 4. Mild multilevel degenerative disc disease and cervical spondylosis, as above. Chest x-ray>>No active cardiopulmonary disease. Medications given: 3% hypertonic saline infusion at 25 mils per hour  PCCM is asked to admit the patient to ICU for further work-up and treatment of Acute Metabolic Encephalopathy in the setting of severe Hyponatremia (Hypotonic Hypovolemic) due to GI losses, along with developing Delirium Tremens.  Nephrology is consulted.   Pertinent  Medical History  Alcohol Abuse Hyponatremia Hypertension Hyperlipidemia Diabetes Mellitus  Micro Data:  12/29/2020: SARS-CoV-2 and influenza PCR>> negative 12/29/2020: GI panel by PCR>> 12/29/2020: C. Difficile>>  Antimicrobials:  N/A  Significant Hospital Events: Including procedures, antibiotic start and stop dates in addition to other pertinent events   12/29/2020: Presented to ED, found to have severe hyponatremia requiring 3% hypertonic saline.  PCCM asked to admit, nephrology consulted.  Starting to exhibit signs of developing DTs. 12/30/2020: Sedated on Precedex for DT's, calm and protecting airway.  3% saline discontinued at 1300  as serum Na increased 7 points in last 24 hrs.   12/31/2020: Weaned off Precedex and tolerating. DDAVP administered x1 due to Na overcorrection.  Awake and alert, eating.   Interim History / Subjective:  -Hypertonic saline discontinued yesterday at 1300  -Received DDAVP this morning due to Na overcorrection, Na 122 this morning  (114 ~ 118 ~ 123) -Afebrile, hemodynamically stable, NO vasopressors -Off Precedex, awake and alert, calm, no signs of DT's -Pt  denies all complaints, passed swallow eval and started  on diet -Nephrology following - GOAL Na+ for tomorrow AM of 125   Objective   Blood pressure (!) 154/68, pulse 75, temperature 98.6 F (37 C), temperature source Oral, resp. rate 14, height 6\' 2"  (1.88 m), weight 101.4 kg, SpO2 100 %.    FiO2 (%):  [32 %] 32 %   Intake/Output Summary (Last 24 hours) at 12/31/2020 1157 Last data filed at 12/31/2020 1100 Gross per 24 hour  Intake 910.26 ml  Output 3750 ml  Net -2839.74 ml    Filed Weights   12/29/20 2025 12/30/20 0403 12/31/20 0350  Weight: 101 kg 101.2 kg 101.4 kg    Examination: General: Acutely ill-appearing male, laying in bed, awake and alert, off Precedex, calm and in NAD HENT: Atraumatic, normocephalic, neck supple, no JVD Lungs: Clear breath sounds to auscultation bilaterally, no wheezing or rales noted, even, nonlabored Cardiovascular: Regular rate and rhythm, S1-S2, no murmurs, rubs, gallops Abdomen: Soft, nontender, nondistended, no guarding rebound tenderness, bowel sounds positive x4 Extremities: Normal bulk and tone, generalized weakness, no deformities, no edema Neuro: Awake, A&O x3, follows commands, no focal deficits, speech clear, pupils PERRL GU: External catheter in place Skin: Warm and dry.  No obvious rashes, lesions, ulcerations  Resolved Hospital Problem list     Assessment & Plan:   Severe Hyponatremia (Hypotonic Hypovolemic) due to GI losses over the last week Hypokalemia -Monitor I&O's / urinary output -Follow BMP -Ensure adequate renal perfusion -Avoid nephrotoxic agents as able -Replace electrolytes as indicated -Serum Osmolality 213, Urine Osmolality 384, Urine Na <10 -Urine osmolality, & urine Na+ pending -Hypertonic 3% Saline infusion discontinued 12/6 @ 13:00 -Follow Serum Na+ q4h -Goal rate of Correction: 4-6 mEq in 24 hrs (MAX RATE OF CORRECTION: 8 mEq in 24 hrs) -Nephrology following, appreciate input ~ goal for tomorrow AM 01/01/21 is serum Na of approximately 614  Acute Metabolic  Encephalopathy in the setting of severe Hyponatremia & severe Delirium Tremens ~ IMPROVED PMHx: ETOH abuse (pt reports 6-8 beers/day) -Correction of Na+ as above -CT Head negative for any acute intracranial process 12/5 -Urine drug screen is negative -Serum Ethyl alcohol  <10 -CIWA Protocol -Precedex as needed ~ weaned off -Thiamine, Folic acid, MV supplementation -Seizure precautions -Provide supportive care  Leukocytosis, suspect due to Viral Gastroenteritis ~ RESOLVED  -Monitor fever curve -Trend WBC's & Procalcitonin -Follow cultures as above -CXR 12/29/20 without acute process -Urinalysis not consistent with UTI -Abdominal exam is benign ~ will check GI panel and C-diff panel if any further diarrhea -Will hold off on empiric antibiotics at this time pending cultures & sensitivities  Thrombocytopenia, suspect due to ETOH abuse Anemia without overt s/sx of bleeding -Monitor for S/Sx of bleeding -Trend CBC -Heparin SQ for VTE Prophylaxis  -Transfuse for Hgb <7 -Smear is normal  PMHx: Hypertension -Continuous cardiac monitoring -Resume home Lisinopril & Norvasc      Best Practice (right click and "Reselect all SmartList Selections" daily)   Diet/type: Regular diet DVT prophylaxis: Lovenox GI prophylaxis: PPI Lines: N/A Foley:  N/A Code Status:  full code Last date of multidisciplinary goals of care discussion [12/31/2020]  No family/contact information listed/available in chart.  Labs   CBC: Recent Labs  Lab 12/29/20 1132 12/30/20 0420 12/31/20 0504  WBC 18.2* 7.7 8.7  NEUTROABS 16.1*  --   --   HGB 15.1 9.4* 13.6  HCT RESULTS UNAVAILABLE DUE TO INTERFERING SUBSTANCE RESULTS UNAVAILABLE DUE TO INTERFERING SUBSTANCE 37.0*  MCV  RESULTS UNAVAILABLE DUE TO INTERFERING SUBSTANCE RESULTS UNAVAILABLE DUE TO INTERFERING SUBSTANCE 85.8  PLT 126* 105* 115*     Basic Metabolic Panel: Recent Labs  Lab 12/29/20 1132 12/29/20 1321 12/30/20 0420 12/30/20 0834  12/30/20 1208 12/30/20 1604 12/30/20 2220 12/31/20 0504 12/31/20 0610 12/31/20 1030  NA <102*   < > 109*   < > 115* 114* 118* 123* 122* 122*  K 3.6  --  2.9*  --  3.7  --   --  3.3*  --   --   CL <65*  --  75*  --   --   --   --  88*  --   --   CO2 26  --  27  --   --   --   --  27  --   --   GLUCOSE 156*  --  113*  --   --   --   --  116*  --   --   BUN 16  --  17  --   --   --   --  18  --   --   CREATININE 0.94  --  0.84  --   --   --   --  0.84  --   --   CALCIUM 8.4*  --  8.0*  --   --   --   --  8.4*  --   --   MG  --   --  2.3  --   --   --   --   --   --   --   PHOS  --   --  3.0  --   --   --   --   --   --   --    < > = values in this interval not displayed.    GFR: Estimated Creatinine Clearance: 105.5 mL/min (by C-G formula based on SCr of 0.84 mg/dL). Recent Labs  Lab 12/29/20 1132 12/30/20 0420 12/31/20 0504  WBC 18.2* 7.7 8.7     Liver Function Tests: Recent Labs  Lab 12/29/20 1132 12/31/20 0504  AST 108* 64*  ALT 41 42  ALKPHOS 48 43  BILITOT 1.7* 1.0  PROT 7.0 5.9*  ALBUMIN 4.0 3.4*    Recent Labs  Lab 12/29/20 1132  LIPASE 46    No results for input(s): AMMONIA in the last 168 hours.  ABG No results found for: PHART, PCO2ART, PO2ART, HCO3, TCO2, ACIDBASEDEF, O2SAT   Coagulation Profile: No results for input(s): INR, PROTIME in the last 168 hours.  Cardiac Enzymes: No results for input(s): CKTOTAL, CKMB, CKMBINDEX, TROPONINI in the last 168 hours.  HbA1C: No results found for: HGBA1C  CBG: Recent Labs  Lab 12/29/20 2025 12/30/20 2338  GLUCAP 126* 98     Review of Systems:   Positives in BOLD: Pt currently denies all complaints Gen: Denies fever, chills, weight change, fatigue, night sweats HEENT: Denies blurred vision, double vision, hearing loss, tinnitus, sinus congestion, rhinorrhea, sore throat, neck stiffness, dysphagia PULM: Denies shortness of breath, cough, sputum production, hemoptysis, wheezing CV: Denies chest  pain, edema, orthopnea, paroxysmal nocturnal dyspnea, palpitations GI: Denies abdominal pain, nausea, vomiting, diarrhea, hematochezia, melena, constipation, change in bowel habits GU: Denies dysuria, hematuria, polyuria, oliguria, urethral discharge Endocrine: Denies hot or cold intolerance, polyuria, polyphagia or appetite change Derm: Denies rash, dry skin, scaling or peeling skin change Heme: Denies easy bruising, bleeding, bleeding gums Neuro: Denies headache, numbness, weakness,  slurred speech, loss of memory or consciousness    Past Medical History:  He,  has a past medical history of Alcohol abuse, Diabetes (Trevor Meyers), Hypertension, and Hyponatremia.   Surgical History:  History reviewed. No pertinent surgical history.   Social History:      Family History:  His family history is not on file.   Allergies Not on File   Home Medications  Prior to Admission medications   Not on File     Critical care time: 38 minutes     Darel Hong, AGACNP-BC [place] epic messenger for cross cover needs If after hours, please call E-link

## 2020-12-31 NOTE — Consult Note (Signed)
[place] for Electrolyte Monitoring and Replacement   Recent Labs: Potassium (mmol/L)  Date Value  12/31/2020 3.3 (L)   Magnesium (mg/dL)  Date Value  12/30/2020 2.3   Calcium (mg/dL)  Date Value  12/31/2020 8.4 (L)   Albumin (g/dL)  Date Value  12/31/2020 3.4 (L)   Phosphorus (mg/dL)  Date Value  12/30/2020 3.0   Sodium (mmol/L)  Date Value  12/31/2020 121 (L)   Assessment: 70 y/o male presented with N/V/D x 1 week. Found to have severely low Na of < 102, Cl < 65. Initiated on 3% NaCl infusion at 25 mL/hr. Pharmacy has been consulted to monitor and replace electrolytes as needed  12/5 1321  <102 12/5 1421  3% NaCl started at 25 cc/hr 12/5 1548  <102 12/5 1826  103 12/5 2143  105 12/6 0420  109  12/6 0834 110 12/6 0849 3% NaCl rate decreased to 15 cc/hr 12/6 1208 115 12/6 1306  3% NaCl d/c'd  12/6 1604  114 12/6 2220  118  12/7 0504  123 12/7 0610  122  12/7 0745 DDAVP 2 mcg IV x 1 12/7 1030  122 12/7 1344  120 12/7 1741  119 12/7 2129  121  Goal of Therapy:  Electrolytes within normal limits No increase in Na in Trevor 2 hr period > 4 mEq/L No increase in Na in Trevor 4 hr period > 6 mEq/L No increase in Na in Trevor 24 hr period > 8 mEq/L   Plan:  --Sodium level trending up again, up by 3 over last 24 hours after rapid overcorrection previously. Per discussion with Nephrology, will continue NaCl 1g PO tablet BID. --UOP 800 mL so far today (6250 mL on 12/6 and 4350 mL on 12/5) --Fluid restriction: None --Continue to monitor Na q4h  Trevor Meyers Trevor Meyers 12/31/2020 10:17 PM

## 2020-12-31 NOTE — Progress Notes (Signed)
PHARMACIST - PHYSICIAN COMMUNICATION  CONCERNING: IV to Oral Route Change Policy  RECOMMENDATION: This patient is receiving pantoprazole by the intravenous route.  Based on criteria approved by the Pharmacy and Therapeutics Committee, the intravenous medication(s) is/are being converted to the equivalent oral dose form(s).   DESCRIPTION: These criteria include: The patient is eating (either orally or via tube) and/or has been taking other orally administered medications for a least 24 hours The patient has no evidence of active gastrointestinal bleeding or impaired GI absorption (gastrectomy, short bowel, patient on TNA or NPO).  If you have questions about this conversion, please contact the [place] 12/31/2020 10:51 AM

## 2020-12-31 NOTE — Consult Note (Signed)
[place] for Electrolyte Monitoring and Replacement   Recent Labs: Potassium (mmol/L)  Date Value  12/31/2020 3.3 (L)   Magnesium (mg/dL)  Date Value  12/30/2020 2.3   Calcium (mg/dL)  Date Value  12/31/2020 8.4 (L)   Albumin (g/dL)  Date Value  12/31/2020 3.4 (L)   Phosphorus (mg/dL)  Date Value  12/30/2020 3.0   Sodium (mmol/L)  Date Value  12/31/2020 122 (L)   Assessment: 70 y/o male presented with N/V/D x 1 week. Found to have severely low Na of < 102, Cl < 65. Initiated on 3% NaCl infusion at 25 mL/hr. Pharmacy has been consulted to monitor and replace electrolytes as needed  12/5 1321  <102 12/5 1421  3% NaCl started at 25 cc/hr 12/5 1548  <102 12/5 1826  103 12/5 2143  105 12/6 0420  109  12/6 0834 110 12/6 0849 3% NaCl rate decreased to 15 cc/hr 12/6 1208 115 12/6 1306  3% NaCl d/c'd  12/6 1604  114 12/6 2220  118  12/7 0610  122   Goal of Therapy:  Electrolytes within normal limits No increase in Na in a 2 hr period > 4 mEq/L No increase in Na in a 4 hr period > 6 mEq/L No increase in Na in a 24 hr period > 8 mEq/L   Plan:  --Hypertonic saline placed on hold given rapid rise in sodium --Continue to monitor Na q4h; follow-up for re-initiation of hypertonic saline versus therapeutic re-lowering with free water  12/7:  Na @ 0610 = 122 - this is ~ 113 mEq/L in 24 hrs.  Relayed this result to MD who will discuss with day team and make treatment decision.   Livan Hires D 12/31/2020 6:48 AM

## 2020-12-31 NOTE — Progress Notes (Signed)
Nutrition Follow Up Note   DOCUMENTATION CODES:   Not applicable  INTERVENTION:   Ensure Enlive po TID, each supplement provides 350 kcal and 20 grams of protein  Magic cup TID with meals, each supplement provides 290 kcal and 9 grams of protein  MVI, folic acid and thiamine daily   Pt at high refeed risk; recommend monitor potassium, magnesium and phosphorus labs daily until stable  NUTRITION DIAGNOSIS:   Inadequate oral intake related to acute illness as evidenced by NPO status. -resolving   GOAL:   Patient will meet greater than or equal to 90% of their needs -progressing   MONITOR:   PO intake, Supplement acceptance, Labs, Weight trends, Skin, I & O's  ASSESSMENT:   70 y/o male with h/o DM, etoh abuse, chronic hyponatremia, HTN, rectal cancer, COPD, anxiety and depression who is admitted with hyponatremia and AMS.  Visited pt's room today. Pt wake and alert. Pt seen by SLP and initiated on a regular diet. Pt with good appetite and oral intake; pt ate 100% of his breakfast this morning. RD will add supplements to help pt meet his estimated needs. Pt is at high refeed risk.    Medications reviewed and include: folic acid, MVI, heparin, protonix, thiamine  Labs reviewed: Na 122(L), K 3.3(L) P 3.0 wnl, Mg 2.3 wnl- 12/6  Diet Order:   Diet Order             Diet 2 gram sodium Room service appropriate? Yes; Fluid consistency: Thin  Diet effective now                  EDUCATION NEEDS:   Not appropriate for education at this time  Skin:  Skin Assessment: Reviewed RN Assessment (ecchymosis)  Last BM:  12/4  Height:   Ht Readings from Last 1 Encounters:  12/29/20 6\' 2"  (1.88 m)    Weight:   Wt Readings from Last 1 Encounters:  12/31/20 101.4 kg    Ideal Body Weight:  86.3 kg  BMI:  Body mass index is 28.7 kg/m.  Estimated Nutritional Needs:   Kcal:  2500-2800kcal/day  Protein:  125-140g/day  Fluid:  2.0L/day  Koleen Distance MS, RD,  LDN Please refer to [facility] for RD and/or RD on-call/weekend/after hours pager

## 2020-12-31 NOTE — Evaluation (Signed)
Clinical/Bedside Swallow Evaluation Patient Details  Name: Trevor Meyers MRN: [id number] Date of Birth: 08/26/1950  Today's Date: 12/31/2020 Time: SLP Start Time (ACUTE ONLY): 1330 SLP Stop Time (ACUTE ONLY): 1345 SLP Time Calculation (min) (ACUTE ONLY): 15 min  Past Medical History:  Past Medical History:  Diagnosis Date   Alcohol abuse    Diabetes ([place])    Hypertension    Hyponatremia    Past Surgical History: History reviewed. No pertinent surgical history. HPI:  Trevor Meyers is a 70 year old male with a past medical history significant for alcohol abuse (reports 6-8 beers per day), hyponatremia requiring hospitalization, hypertension, diabetes mellitus who presented to [facility] ED on 12/29/2020 due to complaints of generalized weakness status post fall at home, nausea, vomiting, diarrhea. CXR 12/5: No active cardiopulmonary disease.    Assessment / Plan / Recommendation  Clinical Impression  Pt with increased alertness per chart and recent initiation of regular solids and thin liquid per nursing. Pt presents with overtly functional oropharyngeal swallow functional as indicated by performance on challenging with PO trials. Pt consuming consecutive straw sips and mixed consistencies with hard solids/thin liquid, without s/sx of aspiration. Pt demonstrated independent manipulation and clearance of regular solids. Education shared for risk of aspiration and aspiration precautions (slow rate, small bites, elevated HOB, and alert for PO intake). Pt reported understanding. No further SLP services indicated. SLP Visit Diagnosis: Dysphagia, oropharyngeal phase (R13.12)    Aspiration Risk  Mild aspiration risk    Diet Recommendation     Medication Administration: Whole meds with liquid    Other  Recommendations Oral Care Recommendations: Oral care BID    Recommendations for follow up therapy are one component of a multi-disciplinary discharge planning process, led by the attending physician.   Recommendations may be updated based on patient status, additional functional criteria and insurance authorization.  Follow up Recommendations No SLP follow up      Assistance Recommended at Discharge    Functional Status Assessment Patient has not had a recent decline in their functional status  Frequency and Duration   Discharge for services         Prognosis  N/a     Swallow Study   General Date of Onset: 12/31/20 HPI: Trevor Meyers is a 70 year old male with a past medical history significant for alcohol abuse (reports 6-8 beers per day), hyponatremia requiring hospitalization, hypertension, diabetes mellitus who presented to [facility] ED on 12/29/2020 due to complaints of generalized weakness status post fall at home, nausea, vomiting, diarrhea. CXR 12/5: No active cardiopulmonary disease. Type of Study: Bedside Swallow Evaluation Previous Swallow Assessment: none on chart Diet Prior to this Study: Regular;Thin liquids Temperature Spikes Noted: No (WBC 8.7 trending down) Respiratory Status: Room air History of Recent Intubation: No Behavior/Cognition: Alert;Cooperative Oral Cavity Assessment: Erythema Oral Care Completed by SLP: No (pt reports recent completion. toothbrush/paste still at bedside.) Oral Cavity - Dentition: Adequate natural dentition Vision: Functional for self-feeding Self-Feeding Abilities: Able to feed self Patient Positioning: Upright in bed Baseline Vocal Quality: Normal Volitional Cough: Strong Volitional Swallow: Able to elicit    Oral/Motor/Sensory Function Overall Oral Motor/Sensory Function: Within functional limits   Ice Chips Ice chips: Not tested   Thin Liquid Thin Liquid: Within functional limits    Nectar Thick Nectar Thick Liquid: Not tested   Honey Thick Honey Thick Liquid: Not tested   Puree Puree: Not tested   Solid     Solid: Within functional limits Presentation: Self Fed     [place] Rosanne Wohlfarth  MS, CCC-SLP  [place] C  Dejohn Ibarra 12/31/2020,2:16 PM

## 2021-01-01 ENCOUNTER — Encounter: Payer: Self-pay | Admitting: Internal Medicine

## 2021-01-01 DIAGNOSIS — G9341 Metabolic encephalopathy: Secondary | ICD-10-CM | POA: Diagnosis not present

## 2021-01-01 DIAGNOSIS — F10931 Alcohol use, unspecified with withdrawal delirium: Secondary | ICD-10-CM | POA: Diagnosis not present

## 2021-01-01 DIAGNOSIS — E871 Hypo-osmolality and hyponatremia: Secondary | ICD-10-CM | POA: Diagnosis not present

## 2021-01-01 LAB — SODIUM
Sodium: 122 mmol/L — ABNORMAL LOW (ref 135–145)
Sodium: 122 mmol/L — ABNORMAL LOW (ref 135–145)
Sodium: 122 mmol/L — ABNORMAL LOW (ref 135–145)
Sodium: 127 mmol/L — ABNORMAL LOW (ref 135–145)
Sodium: 127 mmol/L — ABNORMAL LOW (ref 135–145)

## 2021-01-01 LAB — BASIC METABOLIC PANEL
Anion gap: 9 (ref 5–15)
BUN: 24 mg/dL — ABNORMAL HIGH (ref 8–23)
CO2: 26 mmol/L (ref 22–32)
Calcium: 8.3 mg/dL — ABNORMAL LOW (ref 8.9–10.3)
Chloride: 85 mmol/L — ABNORMAL LOW (ref 98–111)
Creatinine, Ser: 0.89 mg/dL (ref 0.61–1.24)
GFR, Estimated: >60 mL/min (ref >60)
Glucose, Bld: 137 mg/dL — ABNORMAL HIGH (ref 70–99)
Potassium: 3.4 mmol/L — ABNORMAL LOW (ref 3.5–5.1)
Sodium: 120 mmol/L — ABNORMAL LOW (ref 135–145)

## 2021-01-01 LAB — CBC
HCT: 33.2 % — ABNORMAL LOW (ref 39.0–52.0)
Hemoglobin: 12.3 g/dL — ABNORMAL LOW (ref 13.0–17.0)
MCH: 32.3 pg (ref 26.0–34.0)
MCHC: 37 g/dL — ABNORMAL HIGH (ref 30.0–36.0)
MCV: 87.1 fL (ref 80.0–100.0)
Platelets: 130 10*3/uL — ABNORMAL LOW (ref 150–400)
RBC: 3.81 MIL/uL — ABNORMAL LOW (ref 4.22–5.81)
RDW: 12 % (ref 11.5–15.5)
WBC: 9 10*3/uL (ref 4.0–10.5)
nRBC: 0 % (ref 0.0–0.2)

## 2021-01-01 LAB — PHOSPHORUS: Phosphorus: 3.9 mg/dL (ref 2.5–4.6)

## 2021-01-01 LAB — MAGNESIUM: Magnesium: 2.1 mg/dL (ref 1.7–2.4)

## 2021-01-01 MED ORDER — MELATONIN 5 MG PO TABS
5.0000 mg | ORAL_TABLET | Freq: Every day | ORAL | Status: DC
  Administered 2021-01-01 – 2021-01-03 (×4): 5 mg via ORAL
  Filled 2021-01-01 (×3): qty 1

## 2021-01-01 NOTE — Evaluation (Signed)
Occupational Therapy Evaluation Patient Details Name: Trevor Meyers MRN: [id number] DOB: 10-08-1950 Today's Date: 01/01/2021   History of Present Illness Pt admitted for hyponatremia with complaints of N/V/D x 8 days along with AMS. History of fall day of admission along with alcohol dependence.   Clinical Impression   Patient presenting with decreased Ind in self care, balance, functional mobility/transfers, endurance, and safety awareness. Patient reports living at home alone and independently at baseline. He has a girlfriend in [place] and daughter in [place]. Patient currently functioning at min guard - min A with self care and functional mobility. PT does fatigue quickly. Patient will benefit from acute OT to increase overall independence in the areas of ADLs, functional mobility, and safety awareness in order to safely discharge home.      Recommendations for follow up therapy are one component of a multi-disciplinary discharge planning process, led by the attending physician.  Recommendations may be updated based on patient status, additional functional criteria and insurance authorization.   Follow Up Recommendations  Home health OT    Assistance Recommended at Discharge PRN  Functional Status Assessment  Patient has had a recent decline in their functional status and demonstrates the ability to make significant improvements in function in a reasonable and predictable amount of time.  Equipment Recommendations  Other (comment) (RW)       Precautions / Restrictions Precautions Precautions: Fall Restrictions Weight Bearing Restrictions: No      Mobility Bed Mobility               General bed mobility comments: Pt seated in recliner    Transfers Overall transfer level: Needs assistance Equipment used: None Transfers: Sit to/from Stand Sit to Stand: Min guard           General transfer comment: 1st attempt with heavy extensor firing and unsteadiness. Returned  to sitting. Further attempts with cues and correct hand placement performed with cga.      Balance Overall balance assessment: Needs assistance Sitting-balance support: Feet supported;Bilateral upper extremity supported Sitting balance-Leahy Scale: Good     Standing balance support: Bilateral upper extremity supported Standing balance-Leahy Scale: Fair                             ADL either performed or assessed with clinical judgement   ADL Overall ADL's : Needs assistance/impaired     Grooming: Wash/dry hands;Wash/dry face;Sitting;Set up                   Toilet Transfer: Minimal assistance;BSC/3in1;Ambulation Toilet Transfer Details (indicate cue type and reason): without use of RW                 Vision Patient Visual Report: No change from baseline              Pertinent Vitals/Pain Pain Assessment: No/denies pain     Hand Dominance Right   Extremity/Trunk Assessment Upper Extremity Assessment Upper Extremity Assessment: Overall WFL for tasks assessed   Lower Extremity Assessment Lower Extremity Assessment: Generalized weakness       Communication Communication Communication: No difficulties   Cognition Arousal/Alertness: Awake/alert Behavior During Therapy: WFL for tasks assessed/performed Overall Cognitive Status: Within Functional Limits for tasks assessed                                 General Comments: Pt is cooperative. A &  O x4        Exercises Exercises: Other exercises Other Exercises Other Exercises: seated ther-ex performed on B LE including LAQ and alt marching x 5 reps and supervision.        Home Living Family/patient expects to be discharged to:: Private residence Living Arrangements: Alone   Type of Home: House Home Access: Stairs to enter Technical brewer of Steps: 4 Entrance Stairs-Rails: Can reach both Home Layout: One level     Bathroom Shower/Tub: Tub/shower unit          [place]ia Lopez de Gutierrez: None          Prior Functioning/Environment Prior Level of Function : Independent/Modified Independent;Driving;History of Falls (last six months)             Mobility Comments: reports no mobility issues ADLs Comments: Pt reports living at home alone, cooks for himself, and drives. Ind with all self care tasks.        OT Problem List: Decreased strength;Decreased activity tolerance;Impaired balance (sitting and/or standing);Decreased knowledge of use of DME or AE;Decreased safety awareness;Decreased cognition      OT Treatment/Interventions: Self-care/ADL training;Therapeutic exercise;Therapeutic activities;Energy conservation;DME and/or AE instruction;Patient/family education;Balance training;Manual therapy    OT Goals(Current goals can be found in the care plan section) Acute Rehab OT Goals Patient Stated Goal: return home OT Goal Formulation: With patient Time For Goal Achievement: 01/15/21 Potential to Achieve Goals: Good ADL Goals Pt Will Perform Grooming: with modified independence;standing Pt Will Perform Lower Body Dressing: with modified independence;sit to/from stand Pt Will Transfer to Toilet: with modified independence;ambulating Pt Will Perform Toileting - Clothing Manipulation and hygiene: with modified independence;sit to/from stand  OT Frequency: Min 2X/week   Barriers to D/C: Decreased caregiver support             AM-PAC OT "6 Clicks" Daily Activity     Outcome Measure Help from another person eating meals?: None Help from another person taking care of personal grooming?: None Help from another person toileting, which includes using toliet, bedpan, or urinal?: A Little Help from another person bathing (including washing, rinsing, drying)?: A Little Help from another person to put on and taking off regular upper body clothing?: None Help from another person to put on and taking off regular lower body clothing?: A Little 6 Click  Score: 21   End of Session Nurse Communication: Mobility status  Activity Tolerance: Patient tolerated treatment well Patient left: in chair;with call bell/phone within reach;with chair alarm set  OT Visit Diagnosis: Unsteadiness on feet (R26.81);Muscle weakness (generalized) (M62.81)                Time: 1601-0932 OT Time Calculation (min): 17 min Charges:  OT General Charges $OT Visit: 1 Visit OT Evaluation $OT Eval Moderate Complexity: 1 Mod OT Treatments $Self Care/Home Management : 8-22 mins  Darleen Crocker, MS, OTR/L , CBIS ascom [phone]  01/01/21, 12:45 PM

## 2021-01-01 NOTE — Consult Note (Signed)
[facility] for Electrolyte Monitoring and Replacement   Recent Labs: Potassium (mmol/L)  Date Value  01/01/2021 3.4 (L)   Magnesium (mg/dL)  Date Value  01/01/2021 2.1   Calcium (mg/dL)  Date Value  01/01/2021 8.3 (L)   Albumin (g/dL)  Date Value  12/31/2020 3.4 (L)   Phosphorus (mg/dL)  Date Value  01/01/2021 3.9   Sodium (mmol/L)  Date Value  01/01/2021 122 (L)   Assessment: 70 y/o male presented with N/V/D x 1 week. Found to have severely low Na of < 102, Cl < 65. Initiated on 3% NaCl infusion at 25 mL/hr. Pharmacy has been consulted to monitor and replace electrolytes as needed  12/5 1321  <102 12/5 1421  3% NaCl started at 25 cc/hr 12/5 1548  <102 12/5 1826  103 12/5 2143  105 12/6 0420  109  12/6 0834 110 12/6 0849 3% NaCl rate decreased to 15 cc/hr 12/6 1208 115 12/6 1306  3% NaCl d/c'd  12/6 1604  114 12/6 2220  118  12/7 0504  123 12/7 0610  122  12/7 0745 DDAVP 2 mcg IV x 1 12/7 1030  122 12/7 1344  120 12/7 1741  119 12/7 2129  121 12/8 0133  120  12/8 0553  122  Goal of Therapy:  Electrolytes within normal limits No increase in Na in a 2 hr period > 4 mEq/L No increase in Na in a 4 hr period > 6 mEq/L No increase in Na in a 24 hr period > 8 mEq/L   Plan:  --Sodium level trending up again, up by 3 over last 24 hours after rapid overcorrection previously. Per discussion with Nephrology, will continue NaCl 1g PO tablet BID. --UOP 800 mL so far today (6250 mL on 12/6 and 4350 mL on 12/5) --Fluid restriction: None --Continue to monitor Na q4h  Kaid Seeberger D 01/01/2021 7:06 AM

## 2021-01-01 NOTE — Consult Note (Signed)
[facility] for Electrolyte Monitoring and Replacement   Recent Labs: Potassium (mmol/L)  Date Value  01/01/2021 3.4 (L)   Magnesium (mg/dL)  Date Value  01/01/2021 2.1   Calcium (mg/dL)  Date Value  01/01/2021 8.3 (L)   Albumin (g/dL)  Date Value  12/31/2020 3.4 (L)   Phosphorus (mg/dL)  Date Value  01/01/2021 3.9   Sodium (mmol/L)  Date Value  01/01/2021 127 (L)   Assessment: 70 y/o male presented with N/V/D x 1 week. Found to have severely low Na of < 102, Cl < 65. Initiated on 3% NaCl infusion at 25 mL/hr. Pharmacy has been consulted to monitor and replace electrolytes as needed  12/5 1321  <102 12/5 1421  3% NaCl started at 25 cc/hr 12/5 1548  <102 12/5 1826  103 12/5 2143  105 12/6 0420  109  12/6 0834 110 12/6 0849 3% NaCl rate decreased to 15 cc/hr 12/6 1208 115 12/6 1306  3% NaCl d/c'd  12/6 1604  114 12/6 2220  118  12/7 0504  123 12/7 0610  122  12/7 0745 DDAVP 2 mcg IV x 1 12/7 1030  122 12/7 1344  120 12/7 1741  119 12/7 2013 Started on NaCl 1 g BID 12/7 2129  121 12/8 0133  120  12/8 0553  122 12/8 1009  122 12/8 1340  122 12/8 1744  127   Goal of Therapy:  Electrolytes within normal limits No increase in Na in a 2 hr period > 4 mEq/L No increase in Na in a 4 hr period > 6 mEq/L No increase in Na in a 24 hr period > 8 mEq/L   Plan:  --Continue NaCl 1g PO tablet BID per nephrology --UOP: 2275 mL on 12/7 (after DDAVP), 6250 mL on 12/6 and 4350 mL on 12/5 --Fluid restriction: 1500 mL --Continue to monitor Na q4h; frequency per primary team / nephrology --Will begin monitoring peripherally from this point forward. Notes as needed for any major changes  Niema Carrara D 01/01/2021 10:23 PM

## 2021-01-01 NOTE — Consult Note (Addendum)
[facility] for Electrolyte Monitoring and Replacement   Recent Labs: Potassium (mmol/L)  Date Value  01/01/2021 3.4 (L)   Magnesium (mg/dL)  Date Value  01/01/2021 2.1   Calcium (mg/dL)  Date Value  01/01/2021 8.3 (L)   Albumin (g/dL)  Date Value  12/31/2020 3.4 (L)   Phosphorus (mg/dL)  Date Value  01/01/2021 3.9   Sodium (mmol/L)  Date Value  01/01/2021 122 (L)   Assessment: 70 y/o male presented with N/V/D x 1 week. Found to have severely low Na of < 102, Cl < 65. Initiated on 3% NaCl infusion at 25 mL/hr. Pharmacy has been consulted to monitor and replace electrolytes as needed  12/5 1321  <102 12/5 1421  3% NaCl started at 25 cc/hr 12/5 1548  <102 12/5 1826  103 12/5 2143  105 12/6 0420  109  12/6 0834 110 12/6 0849 3% NaCl rate decreased to 15 cc/hr 12/6 1208 115 12/6 1306  3% NaCl d/c'd  12/6 1604  114 12/6 2220  118  12/7 0504  123 12/7 0610  122  12/7 0745 DDAVP 2 mcg IV x 1 12/7 1030  122 12/7 1344  120 12/7 1741  119 12/7 2013 Started on NaCl 1 g BID 12/7 2129  121 12/8 0133  120  12/8 0553  122 12/8 1009  122  Goal of Therapy:  Electrolytes within normal limits No increase in Na in a 2 hr period > 4 mEq/L No increase in Na in a 4 hr period > 6 mEq/L No increase in Na in a 24 hr period > 8 mEq/L   Plan:  --Continue NaCl 1g PO tablet BID per nephrology --UOP: 2275 mL on 12/7 (after DDAVP), 6250 mL on 12/6 and 4350 mL on 12/5 --Fluid restriction: 1500 mL --Continue to monitor Na q4h; frequency per primary team / nephrology --Will begin monitoring peripherally from this point forward. Notes as needed for any major changes  Benita Gutter 01/01/2021 10:28 AM

## 2021-01-01 NOTE — TOC Progression Note (Signed)
Transition of Care [facility]) - Progression Note    Patient Details  Name: Trevor Meyers MRN: [id number] Date of Birth: 13-May-1950  Transition of [facility]) CM/SW Contact  Shelbie Hutching, RN Phone Number: 01/01/2021, 1:18 PM  Clinical Narrative:    Substance abuse resources given to patient to review.  He reports that he does not feel he has a problem with alcohol after having this episode.  He agrees to home health at discharge and he will need a walker ordered before discharge also.  [place] with [facility] accepted referral for PT and OT.  Patient voices he will need a ride home at discharge no once to pick him up.     Expected Discharge Plan: [place] Barriers to Discharge: Continued Medical Work up  Expected Discharge Plan and Services Expected Discharge Plan: [place]   Discharge Planning Services: CM Consult Post Acute Care Choice: [place] arrangements for the past 2 months: Single Family Home                 DME Arranged: [place] Rile DME Agency: [employer]       HH Arranged: PT, OT HH Agency: [place] Date [place]: 01/01/21 Time [place]: 1779 Representative spoke with at [place]: [place]   Social Determinants of Health ([place]) Interventions    Readmission Risk Interventions No flowsheet data found.

## 2021-01-01 NOTE — Consult Note (Signed)
[facility] for Electrolyte Monitoring and Replacement   Recent Labs: Potassium (mmol/L)  Date Value  01/01/2021 3.4 (L)   Magnesium (mg/dL)  Date Value  01/01/2021 2.1   Calcium (mg/dL)  Date Value  01/01/2021 8.3 (L)   Albumin (g/dL)  Date Value  12/31/2020 3.4 (L)   Phosphorus (mg/dL)  Date Value  01/01/2021 3.9   Sodium (mmol/L)  Date Value  01/01/2021 120 (L)   Assessment: 70 y/o male presented with N/V/D x 1 week. Found to have severely low Na of < 102, Cl < 65. Initiated on 3% NaCl infusion at 25 mL/hr. Pharmacy has been consulted to monitor and replace electrolytes as needed  12/5 1321  <102 12/5 1421  3% NaCl started at 25 cc/hr 12/5 1548  <102 12/5 1826  103 12/5 2143  105 12/6 0420  109  12/6 0834 110 12/6 0849 3% NaCl rate decreased to 15 cc/hr 12/6 1208 115 12/6 1306  3% NaCl d/c'd  12/6 1604  114 12/6 2220  118  12/7 0504  123 12/7 0610  122  12/7 0745 DDAVP 2 mcg IV x 1 12/7 1030  122 12/7 1344  120 12/7 1741  119 12/7 2129  121 12/8 0133  120   Goal of Therapy:  Electrolytes within normal limits No increase in Na in a 2 hr period > 4 mEq/L No increase in Na in a 4 hr period > 6 mEq/L No increase in Na in a 24 hr period > 8 mEq/L   Plan:  --Sodium level trending up again, up by 3 over last 24 hours after rapid overcorrection previously. Per discussion with Nephrology, will continue NaCl 1g PO tablet BID. --UOP 800 mL so far today (6250 mL on 12/6 and 4350 mL on 12/5) --Fluid restriction: None --Continue to monitor Na q4h  Alva Broxson D 01/01/2021 2:49 AM

## 2021-01-01 NOTE — Evaluation (Signed)
Physical Therapy Evaluation Patient Details Name: Trevor Meyers MRN: [id number] DOB: [birth year] Today's Date: 01/01/2021  History of Present Illness  Pt admitted for hyponatremia with complaints of N/V/D x 8 days along with AMS. History of fall day of admission along with alcohol dependence.  Clinical Impression  Pt is a pleasant 70 year old male who was admitted for hyponatremia along with DT from alcohol withdrawl. Pt performs transfers with initial mod assist->transitioning to cga and ambulation with cga and RW. Pt demonstrates deficits with strength/mobility/safety awareness. Pt is also limited in social support and reports he has no support system once dc from hospital. Agreeable to using RW to improve balance and fall risk. Would benefit from skilled PT to address above deficits and promote optimal return to PLOF. Recommend transition to [place] upon discharge from acute hospitalization.      Recommendations for follow up therapy are one component of a multi-disciplinary discharge planning process, led by the attending physician.  Recommendations may be updated based on patient status, additional functional criteria and insurance authorization.  Follow Up Recommendations Home health PT    Assistance Recommended at Discharge PRN  Functional Status Assessment Patient has had a recent decline in their functional status and demonstrates the ability to make significant improvements in function in a reasonable and predictable amount of time.  Equipment Recommendations  Rolling walker (2 wheels)    Recommendations for Other Services       Precautions / Restrictions Precautions Precautions: Fall Restrictions Weight Bearing Restrictions: No      Mobility  Bed Mobility               General bed mobility comments: NT as received in recliner    Transfers Overall transfer level: Needs assistance Equipment used: Rolling walker (2 wheels) Transfers: Sit to/from Stand Sit to Stand:  Mod assist           General transfer comment: 1st attempt with heavy extensor firing and unsteadiness. Returned to sitting. Further attempts with cues and correct hand placement performed with cga.    Ambulation/Gait Ambulation/Gait assistance: Min guard Gait Distance (Feet): 120 Feet Assistive device: Rolling walker (2 wheels) Gait Pattern/deviations: Step-through pattern       General Gait Details: ambualted in hallway with fatigue present and pt requesting to turn around. Chair follow due to slight LE tremors and decreased safety awareness. HR increased to 120bpm with exertion.  Stairs            Wheelchair Mobility    Modified Rankin (Stroke Patients Only)       Balance Overall balance assessment: Needs assistance Sitting-balance support: Feet supported;Bilateral upper extremity supported Sitting balance-Leahy Scale: Good     Standing balance support: Bilateral upper extremity supported Standing balance-Leahy Scale: Fair                               Pertinent Vitals/Pain Pain Assessment: No/denies pain    Home Living Family/patient expects to be discharged to:: Private residence Living Arrangements: Alone   Type of Home: House Home Access: Stairs to enter Entrance Stairs-Rails: Can reach both Entrance Stairs-Number of Steps: 4   Home Layout: One level Home Equipment: None      Prior Function Prior Level of Function : Independent/Modified Independent;Driving;History of Falls (last six months)             Mobility Comments: reports no mobility issues       Hand Dominance  Extremity/Trunk Assessment   Upper Extremity Assessment Upper Extremity Assessment: Overall WFL for tasks assessed    Lower Extremity Assessment Lower Extremity Assessment: Generalized weakness (B LE grossly 4/5 and slight tremors)       Communication   Communication: No difficulties  Cognition Arousal/Alertness: Awake/alert Behavior  During Therapy: WFL for tasks assessed/performed Overall Cognitive Status: Within Functional Limits for tasks assessed                                 General Comments: grumpy while awaiting breakfast delivery        General Comments      Exercises Other Exercises Other Exercises: seated ther-ex performed on B LE including LAQ and alt marching x 5 reps and supervision.   Assessment/Plan    PT Assessment Patient needs continued PT services  PT Problem List Decreased strength;Decreased balance;Decreased mobility;Decreased safety awareness       PT Treatment Interventions DME instruction;Gait training;Therapeutic exercise    PT Goals (Current goals can be found in the Care Plan section)  Acute Rehab PT Goals Patient Stated Goal: to go home PT Goal Formulation: With patient Time For Goal Achievement: 01/15/21 Potential to Achieve Goals: Good    Frequency Min 2X/week   Barriers to discharge        Co-evaluation               AM-PAC PT "6 Clicks" Mobility  Outcome Measure Help needed turning from your back to your side while in a flat bed without using bedrails?: A Little Help needed moving from lying on your back to sitting on the side of a flat bed without using bedrails?: A Little Help needed moving to and from a bed to a chair (including a wheelchair)?: A Little Help needed standing up from a chair using your arms (e.g., wheelchair or bedside chair)?: A Little Help needed to walk in hospital room?: A Little Help needed climbing 3-5 steps with a railing? : A Lot 6 Click Score: 17    End of Session Equipment Utilized During Treatment: Gait belt Activity Tolerance: Patient tolerated treatment well Patient left: in chair Nurse Communication: Mobility status PT Visit Diagnosis: History of falling (Z91.81);Unsteadiness on feet (R26.81);Muscle weakness (generalized) (M62.81)    Time: 0569-7948 PT Time Calculation (min) (ACUTE ONLY): 20  min   Charges:   PT Evaluation $PT Eval Low Complexity: 1 Low PT Treatments $Gait Training: 8-22 mins        Greggory Stallion, PT, DPT [phone]   Trevor Meyers 01/01/2021, 10:14 AM

## 2021-01-01 NOTE — Progress Notes (Signed)
[facility]  ROUNDING NOTE   Subjective:   Na 122   Started on salt tabs yesterday  Objective:  Vital signs in last 24 hours:  Temp:  [97.8 F (36.6 C)-98.7 F (37.1 C)] 97.8 F (36.6 C) (12/08 0719) Pulse Rate:  [61-96] 61 (12/08 0800) Resp:  [7-25] 12 (12/08 1000) BP: (121-176)/(51-89) 146/74 (12/08 1000) SpO2:  [96 %-100 %] 98 % (12/08 0800) Weight:  [99.6 kg] 99.6 kg (12/08 0117)  Weight change: -1.8 kg Filed Weights   12/30/20 0403 12/31/20 0350 01/01/21 0117  Weight: 101.2 kg 101.4 kg 99.6 kg    Intake/Output: I/O last 3 completed shifts: In: 946.8 [P.O.:750; I.V.:46.8; IV Piggyback:150] Out: 3875 [Urine:3875]   Intake/Output this shift:  Total I/O In: 137 [P.O.:100; IV Piggyback:37] Out: -   Physical Exam: General: Sitting in chair  Head: Normocephalic, atraumatic. Moist oral mucosal membranes  Eyes: Anicteric, PERRL  Neck: Supple, trachea midline  Lungs:  clear  Heart: Regular rate and rhythm  Abdomen:  Soft, nontender,   Extremities:  no peripheral edema.  Neurologic: Sedated  Skin: No lesions        Basic Metabolic Panel: Recent Labs  Lab 12/29/20 1132 12/29/20 1321 12/30/20 0420 12/30/20 0834 12/30/20 1208 12/30/20 1604 12/31/20 0504 12/31/20 0610 12/31/20 1741 12/31/20 2129 01/01/21 0133 01/01/21 0553 01/01/21 1009  NA <102*   < > 109*   < > 115*   < > 123*   < > 119* 121* 120* 122* 122*  K 3.6  --  2.9*  --  3.7  --  3.3*  --   --   --  3.4*  --   --   CL <65*  --  75*  --   --   --  88*  --   --   --  85*  --   --   CO2 26  --  27  --   --   --  27  --   --   --  26  --   --   GLUCOSE 156*  --  113*  --   --   --  116*  --   --   --  137*  --   --   BUN 16  --  17  --   --   --  18  --   --   --  24*  --   --   CREATININE 0.94  --  0.84  --   --   --  0.84  --   --   --  0.89  --   --   CALCIUM 8.4*  --  8.0*  --   --   --  8.4*  --   --   --  8.3*  --   --   MG  --   --  2.3  --   --   --   --   --   --   --  2.1   --   --   PHOS  --   --  3.0  --   --   --   --   --   --   --  3.9  --   --    < > = values in this interval not displayed.     Liver Function Tests: Recent Labs  Lab 12/29/20 1132 12/31/20 0504  AST 108* 64*  ALT 41 42  ALKPHOS 48 43  BILITOT 1.7* 1.0  PROT 7.0 5.9*  ALBUMIN 4.0 3.4*    Recent Labs  Lab 12/29/20 1132  LIPASE 46    No results for input(s): AMMONIA in the last 168 hours.  CBC: Recent Labs  Lab 12/29/20 1132 12/30/20 0420 12/31/20 0504 01/01/21 0133  WBC 18.2* 7.7 8.7 9.0  NEUTROABS 16.1*  --   --   --   HGB 15.1 9.4* 13.6 12.3*  HCT RESULTS UNAVAILABLE DUE TO INTERFERING SUBSTANCE RESULTS UNAVAILABLE DUE TO INTERFERING SUBSTANCE 37.0* 33.2*  MCV RESULTS UNAVAILABLE DUE TO INTERFERING SUBSTANCE RESULTS UNAVAILABLE DUE TO INTERFERING SUBSTANCE 85.8 87.1  PLT 126* 105* 115* 130*     Cardiac Enzymes: No results for input(s): CKTOTAL, CKMB, CKMBINDEX, TROPONINI in the last 168 hours.  BNP: Invalid input(s): POCBNP  CBG: Recent Labs  Lab 12/29/20 2025 12/30/20 2338  GLUCAP 126* 98     Microbiology: Results for orders placed or performed during the hospital encounter of 12/29/20  Resp Panel by RT-PCR (Flu A&B, Covid) Nasopharyngeal Swab     Status: None   Collection Time: 12/29/20 11:46 AM   Specimen: Nasopharyngeal Swab; Nasopharyngeal(NP) swabs in vial transport medium  Result Value Ref Range Status   SARS Coronavirus 2 by RT PCR NEGATIVE NEGATIVE Final    Comment: (NOTE) SARS-CoV-2 target nucleic acids are NOT DETECTED.  The SARS-CoV-2 RNA is generally detectable in upper respiratory specimens during the acute phase of infection. The lowest concentration of SARS-CoV-2 viral copies this assay can detect is 138 copies/mL. A negative result does not preclude SARS-Cov-2 infection and should not be used as the sole basis for treatment or other patient management decisions. A negative result may occur with  improper specimen  collection/handling, submission of specimen other than nasopharyngeal swab, presence of viral mutation(s) within the areas targeted by this assay, and inadequate number of viral copies(<138 copies/mL). A negative result must be combined with clinical observations, patient history, and epidemiological information. The expected result is Negative.  Fact Sheet for Patients:  EntrepreneurPulse.com.au  Fact Sheet for Healthcare Providers:  IncredibleEmployment.be  This test is no t yet approved or cleared by the [place] FDA and  has been authorized for detection and/or diagnosis of SARS-CoV-2 by FDA under an Emergency Use Authorization (EUA). This EUA will remain  in effect (meaning this test can be used) for the duration of the COVID-19 declaration under Section 564(b)(1) of the Act, 21 U.S.C.section 360bbb-3(b)(1), unless the authorization is terminated  or revoked sooner.       Influenza A by PCR NEGATIVE NEGATIVE Final   Influenza B by PCR NEGATIVE NEGATIVE Final    Comment: (NOTE) The Xpert Xpress SARS-CoV-2/FLU/RSV plus assay is intended as an aid in the diagnosis of influenza from Nasopharyngeal swab specimens and should not be used as a sole basis for treatment. Nasal washings and aspirates are unacceptable for Xpert Xpress SARS-CoV-2/FLU/RSV testing.  Fact Sheet for Patients: EntrepreneurPulse.com.au  Fact Sheet for Healthcare Providers: IncredibleEmployment.be  This test is not yet approved or cleared by the [place] FDA and has been authorized for detection and/or diagnosis of SARS-CoV-2 by FDA under an Emergency Use Authorization (EUA). This EUA will remain in effect (meaning this test can be used) for the duration of the COVID-19 declaration under Section 564(b)(1) of the Act, 21 U.S.C. section 360bbb-3(b)(1), unless the authorization is terminated or revoked.  Performed at [facility], [address], [place], [place] [address]   MRSA Next Gen by PCR, Nasal  Status: None   Collection Time: 12/29/20  8:26 PM   Specimen: Nasal Mucosa; Nasal Swab  Result Value Ref Range Status   MRSA by PCR Next Gen NOT DETECTED NOT DETECTED Final    Comment: (NOTE) The GeneXpert MRSA Assay (FDA approved for NASAL specimens only), is one component of a comprehensive MRSA colonization surveillance program. It is not intended to diagnose MRSA infection nor to guide or monitor treatment for MRSA infections. Test performance is not FDA approved in patients less than 73 years old. Performed at [facility], [address] [place], [place] [address]     Coagulation Studies: No results for input(s): LABPROT, INR in the last 72 hours.  Urinalysis: Recent Labs    12/29/20 1131  COLORURINE YELLOW*  LABSPEC 1.013  PHURINE 6.0  GLUCOSEU 150*  HGBUR MODERATE*  BILIRUBINUR NEGATIVE  KETONESUR 20*  PROTEINUR 100*  NITRITE NEGATIVE  LEUKOCYTESUR NEGATIVE       Imaging: No results found.   Medications:    thiamine injection 100 mL/hr at 01/01/21 1000    amLODipine  5 mg Oral Daily   Chlorhexidine Gluconate Cloth  6 each Topical Q0600   enoxaparin (LOVENOX) injection  40 mg Subcutaneous QHS   feeding supplement  237 mL Oral TID BM   folic acid  1 mg Oral Daily   lisinopril  40 mg Oral Daily   melatonin  5 mg Oral QHS   multivitamin with minerals  1 tablet Oral Daily   pantoprazole  40 mg Oral BID   sodium chloride  1 g Oral BID WC   tamsulosin  0.8 mg Oral Daily   [START ON 01/03/2021] thiamine injection  100 mg Intravenous Daily   acetaminophen, docusate sodium, LORazepam **OR** LORazepam, ondansetron (ZOFRAN) IV, polyethylene glycol  Assessment/ Plan:  Mr. Trevor Meyers is a 70 y.o. white male with hypertension, alcohol abuse, diabetes mellitus type II, neuropathy, anxiety, depression, history of rectal cancer, who was admitted to [facility] on  12/29/2020 for Alcohol abuse [F10.10] Hyponatremia [E87.1] Altered mental status, unspecified altered mental status type [R41.82]  Hyponatremia:  Sodium was critically low on admission secondary to poor solute diet (Beer potomania)  Overcorrected. Status post DDAVP - continue salt tablets - resume regular diet.  - if no improvement, consider normal saline.    LOS: 3 Honest Safranek 12/8/202211:30 AM

## 2021-01-01 NOTE — Progress Notes (Signed)
NAME:  Trevor Meyers, MRN:  [id number], DOB:  12-23-1950, LOS: 3 ADMISSION DATE:  12/29/2020, CONSULTATION DATE:  12/29/20 REFERRING MD:  Dr. Charna Archer, CHIEF COMPLAINT:  AMS, generalized weakness, s/p fall, N/V/D   Brief Pt Description / Synopsis:  70 y.o. Male admitted with Acute Metabolic Encephalopathy in the setting of severe Hyponatremia (Hypotonic Hypovolemic) due to GI losses, along with developing Delirium Tremens.  History of Present Illness:  Trevor Meyers is a 70 year old male with a past medical history significant for alcohol abuse (reports 6-8 beers per day), hyponatremia requiring hospitalization, hypertension, diabetes mellitus who presented to [facility] ED on 12/29/2020 due to complaints of generalized weakness status post fall at home, nausea, vomiting, diarrhea.  Patient is currently confused and seems to be exhibiting signs of developing DT's upon my examination the ED, however no focal neuro deficits noted and patient is able to contribute to history. He reports about a 1 week history of persistent nausea, vomiting (non bloody), diarrhea (non bloody). He denies chest pain,  palpitations, dizziness, cough, shortness of breath, fever, chills, dysuria, abdominal pain, hematemesis or hematochezia.  Over the past 3 days he began to have progressive weakness and began to feel unsteady on his feet, and reported he had a fall this morning and hit his head.  Upon EMS arrival he was noted to be slightly disoriented.  EMS gave a 500 cc bolus of normal saline.  ED Course: Initial vital signs: Temperature 98.4 F orally, respiratory rate 20, pulse 79, blood pressure 190/89, SPO2 96% on room air Significant labs: Sodium < 102, chloride less than 65, glucose 156, BUN 16, creatinine 0.94, alkaline phosphatase 48, lipase 46, AST 108, ALT 41, WBC 18.2, platelets 126 (smear unremarkable), serum ethyl alcohol <10, serum osmolality 213 SARS-CoV-2 and influenza PCR negative Urine drug screen is pending Urine  osmolality and urine sodium pending Imaging: CT head and CT cervical spine>>IMPRESSION: 1. No acute intracranial abnormalities. 2. No acute abnormality of the cervical spine. 3. Mild cerebral atrophy with chronic microvascular ischemic changes in the cerebral white matter, as above. 4. Mild multilevel degenerative disc disease and cervical spondylosis, as above. Chest x-ray>>No active cardiopulmonary disease. Medications given: 3% hypertonic saline infusion at 25 mils per hour  PCCM is asked to admit the patient to ICU for further work-up and treatment of Acute Metabolic Encephalopathy in the setting of severe Hyponatremia (Hypotonic Hypovolemic) due to GI losses, along with developing Delirium Tremens.  Nephrology is consulted.   Pertinent  Medical History  Alcohol Abuse Hyponatremia Hypertension Hyperlipidemia Diabetes Mellitus  Micro Data:  12/29/2020: SARS-CoV-2 and influenza PCR>> negative 12/29/2020: GI panel by PCR>> not collected (no diarrhea while in hospital) 12/29/2020: C. Difficile>> not collected (no diarrhea while in hospital)  Antimicrobials:  N/A  Significant Hospital Events: Including procedures, antibiotic start and stop dates in addition to other pertinent events   12/29/2020: Presented to ED, found to have severe hyponatremia requiring 3% hypertonic saline.  PCCM asked to admit, nephrology consulted.  Starting to exhibit signs of developing DTs. 12/30/2020: Sedated on Precedex for DT's, calm and protecting airway.  3% saline discontinued at 1300  as serum Na increased 7 points in last 24 hrs.   12/31/2020: Weaned off Precedex and tolerating. DDAVP administered x1 due to Na overcorrection.  Awake and alert, eating.  01/01/2021: Na+ virtually unchanged, started on Salt tablets per Nephrology. PT/OT consults. Transfer out of ICU.  Interim History / Subjective:  -No significant events noted overnight -Afebrile, hemodynamically stable -Na+ level stable,  started on  salt tablets by Nephrology -Pt reports generalized weakness and being unstable when getting up to chair ~ will consult PT/OT -Denies chest pain, SOB, N/V/D, abdominal pain, fever, chills -Plan to transfer out of ICU today ~ PCCM to transfer service to [facility] tomorrow    Objective   Blood pressure (!) 121/51, pulse 61, temperature 97.8 F (36.6 C), temperature source Axillary, resp. rate 10, height 6\' 2"  (1.88 m), weight 99.6 kg, SpO2 97 %.        Intake/Output Summary (Last 24 hours) at 01/01/2021 0910 Last data filed at 01/01/2021 0700 Gross per 24 hour  Intake 800 ml  Output 2275 ml  Net -1475 ml    Filed Weights   12/30/20 0403 12/31/20 0350 01/01/21 0117  Weight: 101.2 kg 101.4 kg 99.6 kg    Examination: General: Acutely ill-appearing male, sitting in recliner, awake and alert, off Precedex, calm and in NAD HENT: Atraumatic, normocephalic, neck supple, no JVD Lungs: Clear breath sounds to auscultation bilaterally, no wheezing or rales noted, even, nonlabored Cardiovascular: Regular rate and rhythm, S1-S2, no murmurs, rubs, gallops Abdomen: Soft, nontender, nondistended, no guarding rebound tenderness, bowel sounds positive x4 Extremities: Normal bulk and tone, generalized weakness, no deformities, no edema Neuro: Awake, A&O x3, follows commands, no focal deficits, speech clear, pupils PERRL GU: deferred Skin: Warm and dry.  No obvious rashes, lesions, ulcerations  Resolved Hospital Problem list     Assessment & Plan:   Severe Hyponatremia (Hypotonic Hypovolemic) due to GI losses over the last week ~ IMPROVING Hypokalemia -Monitor I&O's / urinary output -Follow BMP -Ensure adequate renal perfusion -Avoid nephrotoxic agents as able -Replace electrolytes as indicated -Serum Osmolality 213, Urine Osmolality 384, Urine Na <10 -Hypertonic 3% Saline infusion discontinued 12/6 @ 13:00 -Follow Serum Na+ q4h -Goal rate of Correction: 4-6 mEq in 24 hrs (MAX RATE OF CORRECTION:  8 mEq in 24 hrs) -Nephrology following, appreciate input ~ goal for tomorrow AM 01/02/21 is serum Na of approximately 130 -Placed on salt tablets by Nephrology  Acute Metabolic Encephalopathy in the setting of severe Hyponatremia & severe Delirium Tremens ~ IMPROVED PMHx: ETOH abuse (pt reports 6-8 beers/day) -Correction of Na+ as above -CT Head negative for any acute intracranial process 12/5 -Urine drug screen is negative -Serum Ethyl alcohol  <10 -CIWA Protocol -Precedex weaned off -Thiamine, Folic acid, MV supplementation -Seizure precautions -Provide supportive care  Leukocytosis, suspect due to Viral Gastroenteritis ~ RESOLVED  -Monitor fever curve -Trend WBC's & Procalcitonin -Follow cultures as above -CXR 12/29/20 without acute process -Urinalysis not consistent with UTI -Abdominal exam is benign ~ will check GI panel and C-diff panel if any further diarrhea -Will hold off on empiric antibiotics at this time pending cultures & sensitivities  Thrombocytopenia, suspect due to ETOH abuse ~ IMPROVING Anemia without overt s/sx of bleeding -Monitor for S/Sx of bleeding -Trend CBC -Heparin SQ for VTE Prophylaxis  -Transfuse for Hgb <7 -Smear is normal  PMHx: Hypertension -Continue home Lisinopril & Norvasc      Best Practice (right click and "Reselect all SmartList Selections" daily)   Diet/type: Regular diet DVT prophylaxis: Lovenox GI prophylaxis: PPI Lines: N/A Foley:  N/A Code Status:  full code Last date of multidisciplinary goals of care discussion [01/01/2021]  No family/contact information listed/available in chart.  Labs   CBC: Recent Labs  Lab 12/29/20 1132 12/30/20 0420 12/31/20 0504 01/01/21 0133  WBC 18.2* 7.7 8.7 9.0  NEUTROABS 16.1*  --   --   --  HGB 15.1 9.4* 13.6 12.3*  HCT RESULTS UNAVAILABLE DUE TO INTERFERING SUBSTANCE RESULTS UNAVAILABLE DUE TO INTERFERING SUBSTANCE 37.0* 33.2*  MCV RESULTS UNAVAILABLE DUE TO INTERFERING SUBSTANCE  RESULTS UNAVAILABLE DUE TO INTERFERING SUBSTANCE 85.8 87.1  PLT 126* 105* 115* 130*     Basic Metabolic Panel: Recent Labs  Lab 12/29/20 1132 12/29/20 1321 12/30/20 0420 12/30/20 0834 12/30/20 1208 12/30/20 1604 12/31/20 0504 12/31/20 0610 12/31/20 1344 12/31/20 1741 12/31/20 2129 01/01/21 0133 01/01/21 0553  NA <102*   < > 109*   < > 115*   < > 123*   < > 120* 119* 121* 120* 122*  K 3.6  --  2.9*  --  3.7  --  3.3*  --   --   --   --  3.4*  --   CL <65*  --  75*  --   --   --  88*  --   --   --   --  85*  --   CO2 26  --  27  --   --   --  27  --   --   --   --  26  --   GLUCOSE 156*  --  113*  --   --   --  116*  --   --   --   --  137*  --   BUN 16  --  17  --   --   --  18  --   --   --   --  24*  --   CREATININE 0.94  --  0.84  --   --   --  0.84  --   --   --   --  0.89  --   CALCIUM 8.4*  --  8.0*  --   --   --  8.4*  --   --   --   --  8.3*  --   MG  --   --  2.3  --   --   --   --   --   --   --   --  2.1  --   PHOS  --   --  3.0  --   --   --   --   --   --   --   --  3.9  --    < > = values in this interval not displayed.    GFR: Estimated Creatinine Clearance: 98.8 mL/min (by C-G formula based on SCr of 0.89 mg/dL). Recent Labs  Lab 12/29/20 1132 12/30/20 0420 12/31/20 0504 01/01/21 0133  WBC 18.2* 7.7 8.7 9.0     Liver Function Tests: Recent Labs  Lab 12/29/20 1132 12/31/20 0504  AST 108* 64*  ALT 41 42  ALKPHOS 48 43  BILITOT 1.7* 1.0  PROT 7.0 5.9*  ALBUMIN 4.0 3.4*    Recent Labs  Lab 12/29/20 1132  LIPASE 46    No results for input(s): AMMONIA in the last 168 hours.  ABG No results found for: PHART, PCO2ART, PO2ART, HCO3, TCO2, ACIDBASEDEF, O2SAT   Coagulation Profile: No results for input(s): INR, PROTIME in the last 168 hours.  Cardiac Enzymes: No results for input(s): CKTOTAL, CKMB, CKMBINDEX, TROPONINI in the last 168 hours.  HbA1C: No results found for: HGBA1C  CBG: Recent Labs  Lab 12/29/20 2025 12/30/20 2338   GLUCAP 126* 98     Review of Systems:   Positives in BOLD:  Pt currently denies all complaints Gen: Denies fever, chills, weight change, fatigue, night sweats HEENT: Denies blurred vision, double vision, hearing loss, tinnitus, sinus congestion, rhinorrhea, sore throat, neck stiffness, dysphagia PULM: Denies shortness of breath, cough, sputum production, hemoptysis, wheezing CV: Denies chest pain, edema, orthopnea, paroxysmal nocturnal dyspnea, palpitations GI: Denies abdominal pain, nausea, vomiting, diarrhea, hematochezia, melena, constipation, change in bowel habits GU: Denies dysuria, hematuria, polyuria, oliguria, urethral discharge Endocrine: Denies hot or cold intolerance, polyuria, polyphagia or appetite change Derm: Denies rash, dry skin, scaling or peeling skin change Heme: Denies easy bruising, bleeding, bleeding gums Neuro: Denies headache, numbness, weakness, slurred speech, loss of memory or consciousness    Past Medical History:  He,  has a past medical history of Alcohol abuse, Diabetes ([place]), Hypertension, and Hyponatremia.   Surgical History:  History reviewed. No pertinent surgical history.   Social History:      Family History:  His family history is not on file.   Allergies Not on File   Home Medications  Prior to Admission medications   Not on File     Care time: 38 minutes     Darel Hong, AGACNP-BC [place] epic messenger for cross cover needs If after hours, please call E-link

## 2021-01-02 LAB — BASIC METABOLIC PANEL
Anion gap: 6 (ref 5–15)
BUN: 20 mg/dL (ref 8–23)
CO2: 28 mmol/L (ref 22–32)
Calcium: 8.4 mg/dL — ABNORMAL LOW (ref 8.9–10.3)
Chloride: 93 mmol/L — ABNORMAL LOW (ref 98–111)
Creatinine, Ser: 0.9 mg/dL (ref 0.61–1.24)
GFR, Estimated: >60 mL/min (ref >60)
Glucose, Bld: 138 mg/dL — ABNORMAL HIGH (ref 70–99)
Potassium: 3.3 mmol/L — ABNORMAL LOW (ref 3.5–5.1)
Sodium: 127 mmol/L — ABNORMAL LOW (ref 135–145)

## 2021-01-02 LAB — CBC
HCT: 34.2 % — ABNORMAL LOW (ref 39.0–52.0)
Hemoglobin: 12.4 g/dL — ABNORMAL LOW (ref 13.0–17.0)
MCH: 31.8 pg (ref 26.0–34.0)
MCHC: 36.3 g/dL — ABNORMAL HIGH (ref 30.0–36.0)
MCV: 87.7 fL (ref 80.0–100.0)
Platelets: 156 10*3/uL (ref 150–400)
RBC: 3.9 MIL/uL — ABNORMAL LOW (ref 4.22–5.81)
RDW: 12.1 % (ref 11.5–15.5)
WBC: 9 10*3/uL (ref 4.0–10.5)
nRBC: 0 % (ref 0.0–0.2)

## 2021-01-02 LAB — SODIUM
Sodium: 127 mmol/L — ABNORMAL LOW (ref 135–145)
Sodium: 128 mmol/L — ABNORMAL LOW (ref 135–145)
Sodium: 128 mmol/L — ABNORMAL LOW (ref 135–145)

## 2021-01-02 LAB — MAGNESIUM: Magnesium: 2 mg/dL (ref 1.7–2.4)

## 2021-01-02 MED ORDER — POTASSIUM CHLORIDE CRYS ER 20 MEQ PO TBCR
40.0000 meq | EXTENDED_RELEASE_TABLET | Freq: Once | ORAL | Status: AC
  Administered 2021-01-02: 40 meq via ORAL
  Filled 2021-01-02: qty 2

## 2021-01-02 NOTE — Consult Note (Signed)
[place] for Electrolyte Monitoring and Replacement   Recent Labs: Potassium (mmol/L)  Date Value  01/02/2021 3.3 (L)   Magnesium (mg/dL)  Date Value  01/01/2021 2.1   Calcium (mg/dL)  Date Value  01/02/2021 8.4 (L)   Albumin (g/dL)  Date Value  12/31/2020 3.4 (L)   Phosphorus (mg/dL)  Date Value  01/01/2021 3.9   Sodium (mmol/L)  Date Value  01/02/2021 127 (L)   Assessment: 70 y/o male presented with N/V/D x 1 week. Found to have severely low Na of < 102, Cl < 65. Initiated on 3% NaCl infusion at 25 mL/hr. Pharmacy has been consulted to monitor and replace electrolytes as needed  12/5 1321  <102 12/5 1421  3% NaCl started at 25 cc/hr 12/5 1548  <102 12/5 1826  103 12/5 2143  105 12/6 0420  109  12/6 0834 110 12/6 0849 3% NaCl rate decreased to 15 cc/hr 12/6 1208 115 12/6 1306  3% NaCl d/c'd  12/6 1604  114 12/6 2220  118  12/7 0504  123 12/7 0610  122  12/7 0745 DDAVP 2 mcg IV x 1 12/7 1030  122 12/7 1344  120 12/7 1741  119 12/7 2013 Started on NaCl 1 g BID 12/7 2129  121 12/8 0133  120  12/8 0553  122 12/8 1009  122 12/8 1340  122 12/8 1744  127  12/8 2137  127  12/9 0156  127 12/9 0541  127  Goal of Therapy:  Electrolytes within normal limits No increase in Na in a 2 hr period > 4 mEq/L No increase in Na in a 4 hr period > 6 mEq/L No increase in Na in a 24 hr period > 8 mEq/L   Plan:  --Continue NaCl 1g PO tablet BID per nephrology --UOP: 2275 mL on 12/7 (after DDAVP), 6250 mL on 12/6 and 4350 mL on 12/5 --Fluid restriction: 1500 mL --Continue to monitor Na q4h; frequency per primary team / nephrology --Will begin monitoring peripherally from this point forward. Notes as needed for any major changes  Trevor Meyers D 01/02/2021 7:12 AM

## 2021-01-02 NOTE — Consult Note (Signed)
[place] for Electrolyte Monitoring and Replacement   Recent Labs: Potassium (mmol/L)  Date Value  01/02/2021 3.3 (L)   Magnesium (mg/dL)  Date Value  01/01/2021 2.1   Calcium (mg/dL)  Date Value  01/02/2021 8.4 (L)   Albumin (g/dL)  Date Value  12/31/2020 3.4 (L)   Phosphorus (mg/dL)  Date Value  01/01/2021 3.9   Sodium (mmol/L)  Date Value  01/02/2021 127 (L)   Assessment: 70 y/o male presented with N/V/D x 1 week. Found to have severely low Na of < 102, Cl < 65. Initiated on 3% NaCl infusion at 25 mL/hr. Pharmacy has been consulted to monitor and replace electrolytes as needed  12/5 1321  <102 12/5 1421  3% NaCl started at 25 cc/hr 12/5 1548  <102 12/5 1826  103 12/5 2143  105 12/6 0420  109  12/6 0834 110 12/6 0849 3% NaCl rate decreased to 15 cc/hr 12/6 1208 115 12/6 1306  3% NaCl d/c'd  12/6 1604  114 12/6 2220  118  12/7 0504  123 12/7 0610  122  12/7 0745 DDAVP 2 mcg IV x 1 12/7 1030  122 12/7 1344  120 12/7 1741  119 12/7 2013 Started on NaCl 1 g BID 12/7 2129  121 12/8 0133  120  12/8 0553  122 12/8 1009  122 12/8 1340  122 12/8 1744  127  12/8 2137  127  12/9 0156  127  Goal of Therapy:  Electrolytes within normal limits No increase in Na in a 2 hr period > 4 mEq/L No increase in Na in a 4 hr period > 6 mEq/L No increase in Na in a 24 hr period > 8 mEq/L   Plan:  --Continue NaCl 1g PO tablet BID per nephrology --UOP: 2275 mL on 12/7 (after DDAVP), 6250 mL on 12/6 and 4350 mL on 12/5 --Fluid restriction: 1500 mL --Continue to monitor Na q4h; frequency per primary team / nephrology --Will begin monitoring peripherally from this point forward. Notes as needed for any major changes  Khylon Davies D 01/02/2021 2:59 AM

## 2021-01-02 NOTE — Plan of Care (Signed)

## 2021-01-02 NOTE — Progress Notes (Signed)
[facility]  ROUNDING NOTE   Subjective:   Na 127.   Patient states he is eating better.   Objective:  Vital signs in last 24 hours:  Temp:  [97.6 F (36.4 C)-98.9 F (37.2 C)] 98.9 F (37.2 C) (12/09 0800) Pulse Rate:  [59-88] 88 (12/09 0800) Resp:  [9-19] 19 (12/09 0800) BP: (108-176)/(68-93) 108/93 (12/09 0800) SpO2:  [95 %-100 %] 97 % (12/09 0800) Weight:  [99.6 kg] 99.6 kg (12/09 0100)  Weight change: 0 kg Filed Weights   12/31/20 0350 01/01/21 0117 01/02/21 0100  Weight: 101.4 kg 99.6 kg 99.6 kg    Intake/Output: I/O last 3 completed shifts: In: 64 [P.O.:680; IV Piggyback:50] Out: 6295 [Urine:3925]   Intake/Output this shift:  Total I/O In: 305.8 [P.O.:240; IV Piggyback:65.8] Out: 284 [Urine:875]  Physical Exam: General: Sitting in chair  Head: Normocephalic, atraumatic. Moist oral mucosal membranes  Eyes: Anicteric, PERRL  Neck: Supple, trachea midline  Lungs:  clear  Heart: Regular rate and rhythm  Abdomen:  Soft, nontender,   Extremities:  no peripheral edema.  Neurologic: Alert and oriented  Skin: No lesions        Basic Metabolic Panel: Recent Labs  Lab 12/29/20 1132 12/29/20 1321 12/30/20 0420 12/30/20 0834 12/30/20 1208 12/30/20 1604 12/31/20 0504 12/31/20 0610 01/01/21 0133 01/01/21 0553 01/01/21 1340 01/01/21 1744 01/01/21 2137 01/02/21 0156 01/02/21 0541  NA <102*   < > 109*   < > 115*   < > 123*   < > 120*   < > 122* 127* 127* 127* 127*  K 3.6  --  2.9*  --  3.7  --  3.3*  --  3.4*  --   --   --   --  3.3*  --   CL <65*  --  75*  --   --   --  88*  --  85*  --   --   --   --  93*  --   CO2 26  --  27  --   --   --  27  --  26  --   --   --   --  28  --   GLUCOSE 156*  --  113*  --   --   --  116*  --  137*  --   --   --   --  138*  --   BUN 16  --  17  --   --   --  18  --  24*  --   --   --   --  20  --   CREATININE 0.94  --  0.84  --   --   --  0.84  --  0.89  --   --   --   --  0.90  --   CALCIUM 8.4*  --   8.0*  --   --   --  8.4*  --  8.3*  --   --   --   --  8.4*  --   MG  --   --  2.3  --   --   --   --   --  2.1  --   --   --   --   --  2.0  PHOS  --   --  3.0  --   --   --   --   --  3.9  --   --   --   --   --   --    < > =  values in this interval not displayed.     Liver Function Tests: Recent Labs  Lab 12/29/20 1132 12/31/20 0504  AST 108* 64*  ALT 41 42  ALKPHOS 48 43  BILITOT 1.7* 1.0  PROT 7.0 5.9*  ALBUMIN 4.0 3.4*    Recent Labs  Lab 12/29/20 1132  LIPASE 46    No results for input(s): AMMONIA in the last 168 hours.  CBC: Recent Labs  Lab 12/29/20 1132 12/30/20 0420 12/31/20 0504 01/01/21 0133 01/02/21 0156  WBC 18.2* 7.7 8.7 9.0 9.0  NEUTROABS 16.1*  --   --   --   --   HGB 15.1 9.4* 13.6 12.3* 12.4*  HCT RESULTS UNAVAILABLE DUE TO INTERFERING SUBSTANCE RESULTS UNAVAILABLE DUE TO INTERFERING SUBSTANCE 37.0* 33.2* 34.2*  MCV RESULTS UNAVAILABLE DUE TO INTERFERING SUBSTANCE RESULTS UNAVAILABLE DUE TO INTERFERING SUBSTANCE 85.8 87.1 87.7  PLT 126* 105* 115* 130* 156     Cardiac Enzymes: No results for input(s): CKTOTAL, CKMB, CKMBINDEX, TROPONINI in the last 168 hours.  BNP: Invalid input(s): POCBNP  CBG: Recent Labs  Lab 12/29/20 2025 12/30/20 2338  GLUCAP 126* 98     Microbiology: Results for orders placed or performed during the hospital encounter of 12/29/20  Resp Panel by RT-PCR (Flu A&B, Covid) Nasopharyngeal Swab     Status: None   Collection Time: 12/29/20 11:46 AM   Specimen: Nasopharyngeal Swab; Nasopharyngeal(NP) swabs in vial transport medium  Result Value Ref Range Status   SARS Coronavirus 2 by RT PCR NEGATIVE NEGATIVE Final    Comment: (NOTE) SARS-CoV-2 target nucleic acids are NOT DETECTED.  The SARS-CoV-2 RNA is generally detectable in upper respiratory specimens during the acute phase of infection. The lowest concentration of SARS-CoV-2 viral copies this assay can detect is 138 copies/mL. A negative result does not  preclude SARS-Cov-2 infection and should not be used as the sole basis for treatment or other patient management decisions. A negative result may occur with  improper specimen collection/handling, submission of specimen other than nasopharyngeal swab, presence of viral mutation(s) within the areas targeted by this assay, and inadequate number of viral copies(<138 copies/mL). A negative result must be combined with clinical observations, patient history, and epidemiological information. The expected result is Negative.  Fact Sheet for Patients:  EntrepreneurPulse.com.au  Fact Sheet for Healthcare Providers:  IncredibleEmployment.be  This test is no t yet approved or cleared by the [place] FDA and  has been authorized for detection and/or diagnosis of SARS-CoV-2 by FDA under an Emergency Use Authorization (EUA). This EUA will remain  in effect (meaning this test can be used) for the duration of the COVID-19 declaration under Section 564(b)(1) of the Act, 21 U.S.C.section 360bbb-3(b)(1), unless the authorization is terminated  or revoked sooner.       Influenza A by PCR NEGATIVE NEGATIVE Final   Influenza B by PCR NEGATIVE NEGATIVE Final    Comment: (NOTE) The Xpert Xpress SARS-CoV-2/FLU/RSV plus assay is intended as an aid in the diagnosis of influenza from Nasopharyngeal swab specimens and should not be used as a sole basis for treatment. Nasal washings and aspirates are unacceptable for Xpert Xpress SARS-CoV-2/FLU/RSV testing.  Fact Sheet for Patients: EntrepreneurPulse.com.au  Fact Sheet for Healthcare Providers: IncredibleEmployment.be  This test is not yet approved or cleared by the [place] FDA and has been authorized for detection and/or diagnosis of SARS-CoV-2 by FDA under an Emergency Use Authorization (EUA). This EUA will remain in effect (meaning this test can be used) for the  duration of the COVID-19 declaration under Section 564(b)(1) of the Act, 21 U.S.C. section 360bbb-3(b)(1), unless the authorization is terminated or revoked.  Performed at [facility], [address] [place], [place] [address]   MRSA Next Gen by PCR, Nasal     Status: None   Collection Time: 12/29/20  8:26 PM   Specimen: Nasal Mucosa; Nasal Swab  Result Value Ref Range Status   MRSA by PCR Next Gen NOT DETECTED NOT DETECTED Final    Comment: (NOTE) The GeneXpert MRSA Assay (FDA approved for NASAL specimens only), is one component of a comprehensive MRSA colonization surveillance program. It is not intended to diagnose MRSA infection nor to guide or monitor treatment for MRSA infections. Test performance is not FDA approved in patients less than 57 years old. Performed at [facility], [address] [place], [place] [address]     Coagulation Studies: No results for input(s): LABPROT, INR in the last 72 hours.  Urinalysis: No results for input(s): COLORURINE, LABSPEC, PHURINE, GLUCOSEU, HGBUR, BILIRUBINUR, KETONESUR, PROTEINUR, UROBILINOGEN, NITRITE, LEUKOCYTESUR in the last 72 hours.  Invalid input(s): APPERANCEUR     Imaging: No results found.   Medications:      amLODipine  5 mg Oral Daily   Chlorhexidine Gluconate Cloth  6 each Topical Q0600   enoxaparin (LOVENOX) injection  40 mg Subcutaneous QHS   feeding supplement  237 mL Oral TID BM   folic acid  1 mg Oral Daily   lisinopril  40 mg Oral Daily   melatonin  5 mg Oral QHS   multivitamin with minerals  1 tablet Oral Daily   pantoprazole  40 mg Oral BID   potassium chloride  40 mEq Oral Once   sodium chloride  1 g Oral BID WC   tamsulosin  0.8 mg Oral Daily   [START ON 01/03/2021] thiamine injection  100 mg Intravenous Daily   acetaminophen, docusate sodium, ondansetron (ZOFRAN) IV, polyethylene glycol  Assessment/ Plan:  Mr. Jasan Doughtie is a 70 y.o. white male with hypertension,  alcohol abuse, diabetes mellitus type II, neuropathy, anxiety, depression, history of rectal cancer, who was admitted to [facility] on 12/29/2020 for Alcohol abuse [F10.10] Hyponatremia [E87.1] Altered mental status, unspecified altered mental status type [R41.82]  Hyponatremia:  Sodium was critically low on admission secondary to poor solute diet (Beer potomania)  Overcorrected with hypertonic saline. Status post DDAVP. Now improving with salt tablets and improved PO intake.  - continue salt tablets - continue regular diet.  - Continue fluid restriction - alcohol abstinence discussed with patient.    LOS: 4 Gizel Riedlinger 12/9/202211:21 AM

## 2021-01-02 NOTE — Progress Notes (Signed)
   01/02/21 1045  Clinical Encounter Type  Visited With Patient  Visit Type Initial;Social support  Referral From Chaplain  Consult/Referral To Chaplain   Chaplain spoke briefly with PT and established care. Pt spoke a little bit of his upbringing in [place] touched on family dynamics. Chaplain ministered with peace, and reflective listening.   Andee Poles, MDiv

## 2021-01-02 NOTE — Consult Note (Signed)
[facility] for Electrolyte Monitoring and Replacement   Recent Labs: Potassium (mmol/L)  Date Value  01/01/2021 3.4 (L)   Magnesium (mg/dL)  Date Value  01/01/2021 2.1   Calcium (mg/dL)  Date Value  01/01/2021 8.3 (L)   Albumin (g/dL)  Date Value  12/31/2020 3.4 (L)   Phosphorus (mg/dL)  Date Value  01/01/2021 3.9   Sodium (mmol/L)  Date Value  01/01/2021 127 (L)   Assessment: 70 y/o male presented with N/V/D x 1 week. Found to have severely low Na of < 102, Cl < 65. Initiated on 3% NaCl infusion at 25 mL/hr. Pharmacy has been consulted to monitor and replace electrolytes as needed  12/5 1321  <102 12/5 1421  3% NaCl started at 25 cc/hr 12/5 1548  <102 12/5 1826  103 12/5 2143  105 12/6 0420  109  12/6 0834 110 12/6 0849 3% NaCl rate decreased to 15 cc/hr 12/6 1208 115 12/6 1306  3% NaCl d/c'd  12/6 1604  114 12/6 2220  118  12/7 0504  123 12/7 0610  122  12/7 0745 DDAVP 2 mcg IV x 1 12/7 1030  122 12/7 1344  120 12/7 1741  119 12/7 2013 Started on NaCl 1 g BID 12/7 2129  121 12/8 0133  120  12/8 0553  122 12/8 1009  122 12/8 1340  122 12/8 1744  127  12/8 2137  127   Goal of Therapy:  Electrolytes within normal limits No increase in Na in a 2 hr period > 4 mEq/L No increase in Na in a 4 hr period > 6 mEq/L No increase in Na in a 24 hr period > 8 mEq/L   Plan:  --Continue NaCl 1g PO tablet BID per nephrology --UOP: 2275 mL on 12/7 (after DDAVP), 6250 mL on 12/6 and 4350 mL on 12/5 --Fluid restriction: 1500 mL --Continue to monitor Na q4h; frequency per primary team / nephrology --Will begin monitoring peripherally from this point forward. Notes as needed for any major changes  Wednesday Ericsson D 01/02/2021 1:14 AM

## 2021-01-02 NOTE — Progress Notes (Signed)
PROGRESS NOTE    Trevor Meyers  SJG:[id number] DOB: April 12, 1950 DOA: 12/29/2020 PCP: Pcp, No  Outpatient Specialists: surg onc    Brief Narrative:   70m, hx etoh abuse, rectal carcinoma s/p LAR with intermittent chronic diarrhea, hyponatremia, htn, dm, who presented with nausea/vomiting, diarrhea and generalized weakness and confusion. Found to have profound hyponatremia. CT head non-acute. Admitted to ICU and treated w/ hypertonic saline. Nephrology seeing in consultation. Diarrhea/vomiting resolved. Transitioned to hospitalist service 12/9.   Assessment & Plan:   Principal Problem:   Hyponatremia   # Severe symptomatic hyponatremia Thought to be 2/2 low solute beer potomania. Improved from low 100s to 127 today on salt tablets. Urine output is good. - treatment primarily per nephrology, continuing salt tablets - avoiding rapid correction - on 1.5 L fluid restriction  # Debility PT advising HH PT  # Toxic metabolic encephalopathy 2/2 hyponatremia, etoh. Resolved  # Alcoholism Treated initially w/ precedex gtt now weaned off. No signs withdrawal currently - monitor for s/s withdrawal - cont thiamine, folic acid, MV  # Nausea/vomiting/diarrhea Chronic and intermittent s/p LAR; etoh likely also contributes. REsolved since admission to hospital - monitor  # Hypertension Here bp wnl - cont home lisinopril, amlodipine  # T2DM Diet controlled, here glucose not elevated - monitor daily fasting sugars  # BPH - home flomax   DVT prophylaxis: lovenox Code Status: full Family Communication: none @ bedside, declines offer to call family  Level of care: Med-Surg Status is: Inpatient  Remains inpatient appropriate because: severity of illness        Consultants:  nephrology  Procedures: none  Antimicrobials:  none    Subjective: This morning feels well. No n/v/d. No pain. No fever. Tolerating diet.  Objective: Vitals:   01/02/21 0400 01/02/21 0500  01/02/21 0600 01/02/21 0800  BP: (!) 162/68   (!) 108/93  Pulse: 69 62 67 88  Resp: 11 (!) 9 11 19   Temp: 98 F (36.7 C)   98.9 F (37.2 C)  TempSrc: Oral   Oral  SpO2: 98% 99% 97% 97%  Weight:      Height:        Intake/Output Summary (Last 24 hours) at 01/02/2021 0856 Last data filed at 01/02/2021 0805 Gross per 24 hour  Intake 630.03 ml  Output 4075 ml  Net -3444.97 ml   Filed Weights   12/31/20 0350 01/01/21 0117 01/02/21 0100  Weight: 101.4 kg 99.6 kg 99.6 kg    Examination:  General exam: Appears calm and comfortable  Respiratory system: Clear to auscultation. Respiratory effort normal. Cardiovascular system: S1 & S2 heard, RRR. No JVD, murmurs, rubs, gallops or clicks. No pedal edema. Gastrointestinal system: Abdomen is nondistended, soft and nontender. No organomegaly or masses felt. Normal bowel sounds heard. Central nervous system: Alert and oriented. No focal neurological deficits. Extremities: Symmetric 5 x 5 power. Skin: No rashes, lesions or ulcers Psychiatry: Judgement and insight appear normal. Mood appears depressed.    Data Reviewed: I have personally reviewed following labs and imaging studies  CBC: Recent Labs  Lab 12/29/20 1132 12/30/20 0420 12/31/20 0504 01/01/21 0133 01/02/21 0156  WBC 18.2* 7.7 8.7 9.0 9.0  NEUTROABS 16.1*  --   --   --   --   HGB 15.1 9.4* 13.6 12.3* 12.4*  HCT RESULTS UNAVAILABLE DUE TO INTERFERING SUBSTANCE RESULTS UNAVAILABLE DUE TO INTERFERING SUBSTANCE 37.0* 33.2* 34.2*  MCV RESULTS UNAVAILABLE DUE TO INTERFERING SUBSTANCE RESULTS UNAVAILABLE DUE TO INTERFERING SUBSTANCE 85.8 87.1 87.7  PLT 126* 105* 115* 130* 440   Basic Metabolic Panel: Recent Labs  Lab 12/29/20 1132 12/29/20 1321 12/30/20 0420 12/30/20 0834 12/30/20 1208 12/30/20 1604 12/31/20 0504 12/31/20 0610 01/01/21 0133 01/01/21 0553 01/01/21 1340 01/01/21 1744 01/01/21 2137 01/02/21 0156 01/02/21 0541  NA <102*   < > 109*   < > 115*   < >  123*   < > 120*   < > 122* 127* 127* 127* 127*  K 3.6  --  2.9*  --  3.7  --  3.3*  --  3.4*  --   --   --   --  3.3*  --   CL <65*  --  75*  --   --   --  88*  --  85*  --   --   --   --  93*  --   CO2 26  --  27  --   --   --  27  --  26  --   --   --   --  28  --   GLUCOSE 156*  --  113*  --   --   --  116*  --  137*  --   --   --   --  138*  --   BUN 16  --  17  --   --   --  18  --  24*  --   --   --   --  20  --   CREATININE 0.94  --  0.84  --   --   --  0.84  --  0.89  --   --   --   --  0.90  --   CALCIUM 8.4*  --  8.0*  --   --   --  8.4*  --  8.3*  --   --   --   --  8.4*  --   MG  --   --  2.3  --   --   --   --   --  2.1  --   --   --   --   --   --   PHOS  --   --  3.0  --   --   --   --   --  3.9  --   --   --   --   --   --    < > = values in this interval not displayed.   GFR: Estimated Creatinine Clearance: 97.7 mL/min (by C-G formula based on SCr of 0.9 mg/dL). Liver Function Tests: Recent Labs  Lab 12/29/20 1132 12/31/20 0504  AST 108* 64*  ALT 41 42  ALKPHOS 48 43  BILITOT 1.7* 1.0  PROT 7.0 5.9*  ALBUMIN 4.0 3.4*   Recent Labs  Lab 12/29/20 1132  LIPASE 46   No results for input(s): AMMONIA in the last 168 hours. Coagulation Profile: No results for input(s): INR, PROTIME in the last 168 hours. Cardiac Enzymes: No results for input(s): CKTOTAL, CKMB, CKMBINDEX, TROPONINI in the last 168 hours. BNP (last 3 results) No results for input(s): PROBNP in the last 8760 hours. HbA1C: No results for input(s): HGBA1C in the last 72 hours. CBG: Recent Labs  Lab 12/29/20 2025 12/30/20 2338  GLUCAP 126* 98   Lipid Profile: No results for input(s): CHOL, HDL, LDLCALC, TRIG, CHOLHDL, LDLDIRECT in the last 72 hours. Thyroid Function Tests: No results for input(s): TSH,  T4TOTAL, FREET4, T3FREE, THYROIDAB in the last 72 hours. Anemia Panel: No results for input(s): VITAMINB12, FOLATE, FERRITIN, TIBC, IRON, RETICCTPCT in the last 72 hours. Urine analysis:     Component Value Date/Time   COLORURINE YELLOW (A) 12/29/2020 1131   APPEARANCEUR CLEAR (A) 12/29/2020 1131   LABSPEC 1.013 12/29/2020 1131   PHURINE 6.0 12/29/2020 1131   GLUCOSEU 150 (A) 12/29/2020 1131   HGBUR MODERATE (A) 12/29/2020 1131   BILIRUBINUR NEGATIVE 12/29/2020 1131   KETONESUR 20 (A) 12/29/2020 1131   PROTEINUR 100 (A) 12/29/2020 1131   NITRITE NEGATIVE 12/29/2020 1131   LEUKOCYTESUR NEGATIVE 12/29/2020 1131   Sepsis Labs: @LABRCNTIP (procalcitonin:4,lacticidven:4)  ) Recent Results (from the past 240 hour(s))  Resp Panel by RT-PCR (Flu A&B, Covid) Nasopharyngeal Swab     Status: None   Collection Time: 12/29/20 11:46 AM   Specimen: Nasopharyngeal Swab; Nasopharyngeal(NP) swabs in vial transport medium  Result Value Ref Range Status   SARS Coronavirus 2 by RT PCR NEGATIVE NEGATIVE Final    Comment: (NOTE) SARS-CoV-2 target nucleic acids are NOT DETECTED.  The SARS-CoV-2 RNA is generally detectable in upper respiratory specimens during the acute phase of infection. The lowest concentration of SARS-CoV-2 viral copies this assay can detect is 138 copies/mL. A negative result does not preclude SARS-Cov-2 infection and should not be used as the sole basis for treatment or other patient management decisions. A negative result may occur with  improper specimen collection/handling, submission of specimen other than nasopharyngeal swab, presence of viral mutation(s) within the areas targeted by this assay, and inadequate number of viral copies(<138 copies/mL). A negative result must be combined with clinical observations, patient history, and epidemiological information. The expected result is Negative.  Fact Sheet for Patients:  EntrepreneurPulse.com.au  Fact Sheet for Healthcare Providers:  IncredibleEmployment.be  This test is no t yet approved or cleared by the [place] FDA and  has been authorized for detection and/or  diagnosis of SARS-CoV-2 by FDA under an Emergency Use Authorization (EUA). This EUA will remain  in effect (meaning this test can be used) for the duration of the COVID-19 declaration under Section 564(b)(1) of the Act, 21 U.S.C.section 360bbb-3(b)(1), unless the authorization is terminated  or revoked sooner.       Influenza A by PCR NEGATIVE NEGATIVE Final   Influenza B by PCR NEGATIVE NEGATIVE Final    Comment: (NOTE) The Xpert Xpress SARS-CoV-2/FLU/RSV plus assay is intended as an aid in the diagnosis of influenza from Nasopharyngeal swab specimens and should not be used as a sole basis for treatment. Nasal washings and aspirates are unacceptable for Xpert Xpress SARS-CoV-2/FLU/RSV testing.  Fact Sheet for Patients: EntrepreneurPulse.com.au  Fact Sheet for Healthcare Providers: IncredibleEmployment.be  This test is not yet approved or cleared by the [place] FDA and has been authorized for detection and/or diagnosis of SARS-CoV-2 by FDA under an Emergency Use Authorization (EUA). This EUA will remain in effect (meaning this test can be used) for the duration of the COVID-19 declaration under Section 564(b)(1) of the Act, 21 U.S.C. section 360bbb-3(b)(1), unless the authorization is terminated or revoked.  Performed at [facility], [address] [place], [place] [address]   MRSA Next Gen by PCR, Nasal     Status: None   Collection Time: 12/29/20  8:26 PM   Specimen: Nasal Mucosa; Nasal Swab  Result Value Ref Range Status   MRSA by PCR Next Gen NOT DETECTED NOT DETECTED Final    Comment: (NOTE) The GeneXpert MRSA Assay (  FDA approved for NASAL specimens only), is one component of a comprehensive MRSA colonization surveillance program. It is not intended to diagnose MRSA infection nor to guide or monitor treatment for MRSA infections. Test performance is not FDA approved in patients less than 65 years old. Performed at  [facility], [address], [place], [place] [address]          Radiology Studies: No results found.      Scheduled Meds:  amLODipine  5 mg Oral Daily   Chlorhexidine Gluconate Cloth  6 each Topical Q0600   enoxaparin (LOVENOX) injection  40 mg Subcutaneous QHS   feeding supplement  237 mL Oral TID BM   folic acid  1 mg Oral Daily   lisinopril  40 mg Oral Daily   melatonin  5 mg Oral QHS   multivitamin with minerals  1 tablet Oral Daily   pantoprazole  40 mg Oral BID   sodium chloride  1 g Oral BID WC   tamsulosin  0.8 mg Oral Daily   [START ON 01/03/2021] thiamine injection  100 mg Intravenous Daily   Continuous Infusions:  thiamine injection Stopped (01/01/21 1007)     LOS: 4 days    Time spent: 88 min    Desma Maxim, MD [facility]   If 7PM-7AM, please contact night-coverage [email or web address] Password [email or web address] 01/02/2021, 8:56 AM

## 2021-01-02 NOTE — Progress Notes (Signed)
[facility]  ROUNDING NOTE   Subjective:   Na 127.   Patient states he is eating better.   Objective:  Vital signs in last 24 hours:  Temp:  [97.6 F (36.4 C)-98.9 F (37.2 C)] 98.9 F (37.2 C) (12/09 0800) Pulse Rate:  [59-88] 88 (12/09 0800) Resp:  [9-19] 19 (12/09 0800) BP: (108-176)/(68-93) 108/93 (12/09 0800) SpO2:  [95 %-100 %] 97 % (12/09 0800) Weight:  [99.6 kg] 99.6 kg (12/09 0100)  Weight change: 0 kg Filed Weights   12/31/20 0350 01/01/21 0117 01/02/21 0100  Weight: 101.4 kg 99.6 kg 99.6 kg    Intake/Output: I/O last 3 completed shifts: In: 102 [P.O.:680; IV Piggyback:50] Out: 1610 [Urine:3925]   Intake/Output this shift:  Total I/O In: 305.8 [P.O.:240; IV Piggyback:65.8] Out: 960 [Urine:875]  Physical Exam: General: Sitting in chair  Head: Normocephalic, atraumatic. Moist oral mucosal membranes  Eyes: Anicteric, PERRL  Neck: Supple, trachea midline  Lungs:  clear  Heart: Regular rate and rhythm  Abdomen:  Soft, nontender,   Extremities:  no peripheral edema.  Neurologic: Alert and oriented  Skin: No lesions        Basic Metabolic Panel: Recent Labs  Lab 12/29/20 1132 12/29/20 1321 12/30/20 0420 12/30/20 0834 12/30/20 1208 12/30/20 1604 12/31/20 0504 12/31/20 0610 01/01/21 0133 01/01/21 0553 01/01/21 1340 01/01/21 1744 01/01/21 2137 01/02/21 0156 01/02/21 0541  NA <102*   < > 109*   < > 115*   < > 123*   < > 120*   < > 122* 127* 127* 127* 127*  K 3.6  --  2.9*  --  3.7  --  3.3*  --  3.4*  --   --   --   --  3.3*  --   CL <65*  --  75*  --   --   --  88*  --  85*  --   --   --   --  93*  --   CO2 26  --  27  --   --   --  27  --  26  --   --   --   --  28  --   GLUCOSE 156*  --  113*  --   --   --  116*  --  137*  --   --   --   --  138*  --   BUN 16  --  17  --   --   --  18  --  24*  --   --   --   --  20  --   CREATININE 0.94  --  0.84  --   --   --  0.84  --  0.89  --   --   --   --  0.90  --   CALCIUM 8.4*  --   8.0*  --   --   --  8.4*  --  8.3*  --   --   --   --  8.4*  --   MG  --   --  2.3  --   --   --   --   --  2.1  --   --   --   --   --  2.0  PHOS  --   --  3.0  --   --   --   --   --  3.9  --   --   --   --   --   --    < > =  values in this interval not displayed.     Liver Function Tests: Recent Labs  Lab 12/29/20 1132 12/31/20 0504  AST 108* 64*  ALT 41 42  ALKPHOS 48 43  BILITOT 1.7* 1.0  PROT 7.0 5.9*  ALBUMIN 4.0 3.4*    Recent Labs  Lab 12/29/20 1132  LIPASE 46    No results for input(s): AMMONIA in the last 168 hours.  CBC: Recent Labs  Lab 12/29/20 1132 12/30/20 0420 12/31/20 0504 01/01/21 0133 01/02/21 0156  WBC 18.2* 7.7 8.7 9.0 9.0  NEUTROABS 16.1*  --   --   --   --   HGB 15.1 9.4* 13.6 12.3* 12.4*  HCT RESULTS UNAVAILABLE DUE TO INTERFERING SUBSTANCE RESULTS UNAVAILABLE DUE TO INTERFERING SUBSTANCE 37.0* 33.2* 34.2*  MCV RESULTS UNAVAILABLE DUE TO INTERFERING SUBSTANCE RESULTS UNAVAILABLE DUE TO INTERFERING SUBSTANCE 85.8 87.1 87.7  PLT 126* 105* 115* 130* 156     Cardiac Enzymes: No results for input(s): CKTOTAL, CKMB, CKMBINDEX, TROPONINI in the last 168 hours.  BNP: Invalid input(s): POCBNP  CBG: Recent Labs  Lab 12/29/20 2025 12/30/20 2338  GLUCAP 126* 98     Microbiology: Results for orders placed or performed during the hospital encounter of 12/29/20  Resp Panel by RT-PCR (Flu A&B, Covid) Nasopharyngeal Swab     Status: None   Collection Time: 12/29/20 11:46 AM   Specimen: Nasopharyngeal Swab; Nasopharyngeal(NP) swabs in vial transport medium  Result Value Ref Range Status   SARS Coronavirus 2 by RT PCR NEGATIVE NEGATIVE Final    Comment: (NOTE) SARS-CoV-2 target nucleic acids are NOT DETECTED.  The SARS-CoV-2 RNA is generally detectable in upper respiratory specimens during the acute phase of infection. The lowest concentration of SARS-CoV-2 viral copies this assay can detect is 138 copies/mL. A negative result does not  preclude SARS-Cov-2 infection and should not be used as the sole basis for treatment or other patient management decisions. A negative result may occur with  improper specimen collection/handling, submission of specimen other than nasopharyngeal swab, presence of viral mutation(s) within the areas targeted by this assay, and inadequate number of viral copies(<138 copies/mL). A negative result must be combined with clinical observations, patient history, and epidemiological information. The expected result is Negative.  Fact Sheet for Patients:  EntrepreneurPulse.com.au  Fact Sheet for Healthcare Providers:  IncredibleEmployment.be  This test is no t yet approved or cleared by the [place] FDA and  has been authorized for detection and/or diagnosis of SARS-CoV-2 by FDA under an Emergency Use Authorization (EUA). This EUA will remain  in effect (meaning this test can be used) for the duration of the COVID-19 declaration under Section 564(b)(1) of the Act, 21 U.S.C.section 360bbb-3(b)(1), unless the authorization is terminated  or revoked sooner.       Influenza A by PCR NEGATIVE NEGATIVE Final   Influenza B by PCR NEGATIVE NEGATIVE Final    Comment: (NOTE) The Xpert Xpress SARS-CoV-2/FLU/RSV plus assay is intended as an aid in the diagnosis of influenza from Nasopharyngeal swab specimens and should not be used as a sole basis for treatment. Nasal washings and aspirates are unacceptable for Xpert Xpress SARS-CoV-2/FLU/RSV testing.  Fact Sheet for Patients: EntrepreneurPulse.com.au  Fact Sheet for Healthcare Providers: IncredibleEmployment.be  This test is not yet approved or cleared by the [place] FDA and has been authorized for detection and/or diagnosis of SARS-CoV-2 by FDA under an Emergency Use Authorization (EUA). This EUA will remain in effect (meaning this test can be used) for the  duration of the COVID-19 declaration under Section 564(b)(1) of the Act, 21 U.S.C. section 360bbb-3(b)(1), unless the authorization is terminated or revoked.  Performed at [facility], [address] [place], [place] [address]   MRSA Next Gen by PCR, Nasal     Status: None   Collection Time: 12/29/20  8:26 PM   Specimen: Nasal Mucosa; Nasal Swab  Result Value Ref Range Status   MRSA by PCR Next Gen NOT DETECTED NOT DETECTED Final    Comment: (NOTE) The GeneXpert MRSA Assay (FDA approved for NASAL specimens only), is one component of a comprehensive MRSA colonization surveillance program. It is not intended to diagnose MRSA infection nor to guide or monitor treatment for MRSA infections. Test performance is not FDA approved in patients less than 64 years old. Performed at [facility], [address] [place],  [address]     Coagulation Studies: No results for input(s): LABPROT, INR in the last 72 hours.  Urinalysis: No results for input(s): COLORURINE, LABSPEC, PHURINE, GLUCOSEU, HGBUR, BILIRUBINUR, KETONESUR, PROTEINUR, UROBILINOGEN, NITRITE, LEUKOCYTESUR in the last 72 hours.  Invalid input(s): APPERANCEUR     Imaging: No results found.   Medications:      amLODipine  5 mg Oral Daily   Chlorhexidine Gluconate Cloth  6 each Topical Q0600   enoxaparin (LOVENOX) injection  40 mg Subcutaneous QHS   feeding supplement  237 mL Oral TID BM   folic acid  1 mg Oral Daily   lisinopril  40 mg Oral Daily   melatonin  5 mg Oral QHS   multivitamin with minerals  1 tablet Oral Daily   pantoprazole  40 mg Oral BID   potassium chloride  40 mEq Oral Once   sodium chloride  1 g Oral BID WC   tamsulosin  0.8 mg Oral Daily   [START ON 01/03/2021] thiamine injection  100 mg Intravenous Daily   acetaminophen, docusate sodium, ondansetron (ZOFRAN) IV, polyethylene glycol  Assessment/ Plan:  Trevor Meyers is a 70 y.o. white male with hypertension,  alcohol abuse, diabetes mellitus type II, neuropathy, anxiety, depression, history of rectal cancer, who was admitted to [facility] on 12/29/2020 for Alcohol abuse [F10.10] Hyponatremia [E87.1] Altered mental status, unspecified altered mental status type [R41.82]  Hyponatremia:  Sodium was critically low on admission secondary to poor solute diet (Beer potomania)  Overcorrected with hypertonic saline. Status post DDAVP. Now improving with salt tablets and improved PO intake.  - continue salt tablets - continue regular diet.  - Continue fluid restriction - alcohol abstinence discussed with patient.    LOS: 4 Heyden Jaber 12/9/202210:57 AM

## 2021-01-03 LAB — BASIC METABOLIC PANEL
Anion gap: 9 (ref 5–15)
BUN: 20 mg/dL (ref 8–23)
CO2: 27 mmol/L (ref 22–32)
Calcium: 8.8 mg/dL — ABNORMAL LOW (ref 8.9–10.3)
Chloride: 96 mmol/L — ABNORMAL LOW (ref 98–111)
Creatinine, Ser: 0.95 mg/dL (ref 0.61–1.24)
GFR, Estimated: >60 mL/min (ref >60)
Glucose, Bld: 163 mg/dL — ABNORMAL HIGH (ref 70–99)
Potassium: 4 mmol/L (ref 3.5–5.1)
Sodium: 132 mmol/L — ABNORMAL LOW (ref 135–145)

## 2021-01-03 LAB — MAGNESIUM: Magnesium: 1.9 mg/dL (ref 1.7–2.4)

## 2021-01-03 LAB — SODIUM: Sodium: 130 mmol/L — ABNORMAL LOW (ref 135–145)

## 2021-01-03 MED ORDER — AMLODIPINE BESYLATE 10 MG PO TABS
10.0000 mg | ORAL_TABLET | Freq: Every day | ORAL | Status: DC
  Administered 2021-01-04: 10 mg via ORAL
  Filled 2021-01-03: qty 1

## 2021-01-03 NOTE — Progress Notes (Addendum)
[facility]  ROUNDING NOTE   Subjective:   Patient found resting in bed, in no acute distress. He denies SOB, nausea, vomiting or anorexia. He reports feeling fatigued.  Objective:  Vital signs in last 24 hours:  Temp:  [97.3 F (36.3 C)-98.4 F (36.9 C)] 98.4 F (36.9 C) (12/10 1227) Pulse Rate:  [61-71] 71 (12/10 1227) Resp:  [17-18] 17 (12/10 1227) BP: (143-172)/(67-82) 167/69 (12/10 1227) SpO2:  [99 %-100 %] 99 % (12/10 1227) Weight:  [98.6 kg] 98.6 kg (12/10 0500)  Weight change: -1 kg Filed Weights   01/01/21 0117 01/02/21 0100 01/03/21 0500  Weight: 99.6 kg 99.6 kg 98.6 kg    Intake/Output: I/O last 3 completed shifts: In: 845.8 [P.O.:780; IV Piggyback:65.8] Out: 2975 [Urine:2975]   Intake/Output this shift:  Total I/O In: 120 [P.O.:120] Out: -   Physical Exam: General: Awake, alert, in no distress  Head: Moist oral mucosal membranes  Lungs:  Respirations even, unlabored, lungs clear  Heart: S1S2, no rubs or gallops  Abdomen:  Non distended  Extremities:  no peripheral edema.  Neurologic: Oriented x 3  Skin: No acute lesions or rashes        Basic Metabolic Panel: Recent Labs  Lab 12/30/20 0420 12/30/20 0834 12/30/20 1208 12/30/20 1604 12/31/20 0504 12/31/20 0610 01/01/21 0133 01/01/21 0553 01/02/21 0156 01/02/21 0541 01/02/21 1300 01/02/21 2052 01/03/21 0614 01/03/21 1250  NA 109*   < > 115*   < > 123*   < > 120*   < > 127* 127* 128* 128* 132* 130*  K 2.9*  --  3.7  --  3.3*  --  3.4*  --  3.3*  --   --   --  4.0  --   CL 75*  --   --   --  88*  --  85*  --  93*  --   --   --  96*  --   CO2 27  --   --   --  27  --  26  --  28  --   --   --  27  --   GLUCOSE 113*  --   --   --  116*  --  137*  --  138*  --   --   --  163*  --   BUN 17  --   --   --  18  --  24*  --  20  --   --   --  20  --   CREATININE 0.84  --   --   --  0.84  --  0.89  --  0.90  --   --   --  0.95  --   CALCIUM 8.0*  --   --   --  8.4*  --  8.3*  --  8.4*   --   --   --  8.8*  --   MG 2.3  --   --   --   --   --  2.1  --   --  2.0  --   --  1.9  --   PHOS 3.0  --   --   --   --   --  3.9  --   --   --   --   --   --   --    < > = values in this interval not displayed.     Liver Function Tests: Recent  Labs  Lab 12/29/20 1132 12/31/20 0504  AST 108* 64*  ALT 41 42  ALKPHOS 48 43  BILITOT 1.7* 1.0  PROT 7.0 5.9*  ALBUMIN 4.0 3.4*    Recent Labs  Lab 12/29/20 1132  LIPASE 46    No results for input(s): AMMONIA in the last 168 hours.  CBC: Recent Labs  Lab 12/29/20 1132 12/30/20 0420 12/31/20 0504 01/01/21 0133 01/02/21 0156  WBC 18.2* 7.7 8.7 9.0 9.0  NEUTROABS 16.1*  --   --   --   --   HGB 15.1 9.4* 13.6 12.3* 12.4*  HCT RESULTS UNAVAILABLE DUE TO INTERFERING SUBSTANCE RESULTS UNAVAILABLE DUE TO INTERFERING SUBSTANCE 37.0* 33.2* 34.2*  MCV RESULTS UNAVAILABLE DUE TO INTERFERING SUBSTANCE RESULTS UNAVAILABLE DUE TO INTERFERING SUBSTANCE 85.8 87.1 87.7  PLT 126* 105* 115* 130* 156     Cardiac Enzymes: No results for input(s): CKTOTAL, CKMB, CKMBINDEX, TROPONINI in the last 168 hours.  BNP: Invalid input(s): POCBNP  CBG: Recent Labs  Lab 12/29/20 2025 12/30/20 2338  GLUCAP 126* 98     Microbiology: Results for orders placed or performed during the hospital encounter of 12/29/20  Resp Panel by RT-PCR (Flu A&B, Covid) Nasopharyngeal Swab     Status: None   Collection Time: 12/29/20 11:46 AM   Specimen: Nasopharyngeal Swab; Nasopharyngeal(NP) swabs in vial transport medium  Result Value Ref Range Status   SARS Coronavirus 2 by RT PCR NEGATIVE NEGATIVE Final    Comment: (NOTE) SARS-CoV-2 target nucleic acids are NOT DETECTED.  The SARS-CoV-2 RNA is generally detectable in upper respiratory specimens during the acute phase of infection. The lowest concentration of SARS-CoV-2 viral copies this assay can detect is 138 copies/mL. A negative result does not preclude SARS-Cov-2 infection and should not be used  as the sole basis for treatment or other patient management decisions. A negative result may occur with  improper specimen collection/handling, submission of specimen other than nasopharyngeal swab, presence of viral mutation(s) within the areas targeted by this assay, and inadequate number of viral copies(<138 copies/mL). A negative result must be combined with clinical observations, patient history, and epidemiological information. The expected result is Negative.  Fact Sheet for Patients:  EntrepreneurPulse.com.au  Fact Sheet for Healthcare Providers:  IncredibleEmployment.be  This test is no t yet approved or cleared by the [place] FDA and  has been authorized for detection and/or diagnosis of SARS-CoV-2 by FDA under an Emergency Use Authorization (EUA). This EUA will remain  in effect (meaning this test can be used) for the duration of the COVID-19 declaration under Section 564(b)(1) of the Act, 21 U.S.C.section 360bbb-3(b)(1), unless the authorization is terminated  or revoked sooner.       Influenza A by PCR NEGATIVE NEGATIVE Final   Influenza B by PCR NEGATIVE NEGATIVE Final    Comment: (NOTE) The Xpert Xpress SARS-CoV-2/FLU/RSV plus assay is intended as an aid in the diagnosis of influenza from Nasopharyngeal swab specimens and should not be used as a sole basis for treatment. Nasal washings and aspirates are unacceptable for Xpert Xpress SARS-CoV-2/FLU/RSV testing.  Fact Sheet for Patients: EntrepreneurPulse.com.au  Fact Sheet for Healthcare Providers: IncredibleEmployment.be  This test is not yet approved or cleared by the [place] FDA and has been authorized for detection and/or diagnosis of SARS-CoV-2 by FDA under an Emergency Use Authorization (EUA). This EUA will remain in effect (meaning this test can be used) for the duration of the COVID-19 declaration under Section 564(b)(1)  of the Act, 21 U.S.C. section  360bbb-3(b)(1), unless the authorization is terminated or revoked.  Performed at [facility], [address] [place], [place] [address]   MRSA Next Gen by PCR, Nasal     Status: None   Collection Time: 12/29/20  8:26 PM   Specimen: Nasal Mucosa; Nasal Swab  Result Value Ref Range Status   MRSA by PCR Next Gen NOT DETECTED NOT DETECTED Final    Comment: (NOTE) The GeneXpert MRSA Assay (FDA approved for NASAL specimens only), is one component of a comprehensive MRSA colonization surveillance program. It is not intended to diagnose MRSA infection nor to guide or monitor treatment for MRSA infections. Test performance is not FDA approved in patients less than 14 years old. Performed at [facility], [address] [place],  [address]     Coagulation Studies: No results for input(s): LABPROT, INR in the last 72 hours.  Urinalysis: No results for input(s): COLORURINE, LABSPEC, PHURINE, GLUCOSEU, HGBUR, BILIRUBINUR, KETONESUR, PROTEINUR, UROBILINOGEN, NITRITE, LEUKOCYTESUR in the last 72 hours.  Invalid input(s): APPERANCEUR     Imaging: No results found.   Medications:      [START ON 01/04/2021] amLODipine  10 mg Oral Daily   Chlorhexidine Gluconate Cloth  6 each Topical Q0600   enoxaparin (LOVENOX) injection  40 mg Subcutaneous QHS   feeding supplement  237 mL Oral TID BM   folic acid  1 mg Oral Daily   lisinopril  40 mg Oral Daily   melatonin  5 mg Oral QHS   multivitamin with minerals  1 tablet Oral Daily   pantoprazole  40 mg Oral BID   sodium chloride  1 g Oral BID WC   tamsulosin  0.8 mg Oral Daily   thiamine injection  100 mg Intravenous Daily   acetaminophen, docusate sodium, ondansetron (ZOFRAN) IV, polyethylene glycol  Assessment/ Plan:  Trevor Meyers is a 70 y.o. white male with hypertension, alcohol abuse, diabetes mellitus type II, neuropathy, anxiety, depression, history of rectal cancer, who  was admitted to [facility] on 12/29/2020 for Alcohol abuse [F10.10] Hyponatremia [E87.1] Altered mental status, unspecified altered mental status type [R41.82]  # Hyponatremia:  Sodium was critically low on admission secondary to poor solute diet (Beer potomania)  Improved to 132 today,on Salt tablets Patient aware of refraining from alcohol abuse Defer to primary team for discharge planning   LOS: 5 Tyerra Loretto 12/10/20222:57 PM

## 2021-01-03 NOTE — Progress Notes (Signed)
PROGRESS NOTE    Trevor Meyers  VEL:[id number] DOB: 1950/05/02 DOA: 12/29/2020 PCP: Pcp, No  Outpatient Specialists: surg onc    Brief Narrative:   70m, hx etoh abuse, rectal carcinoma s/p LAR with intermittent chronic diarrhea, hyponatremia, htn, dm, who presented with nausea/vomiting, diarrhea and generalized weakness and confusion. Found to have profound hyponatremia. CT head non-acute. Admitted to ICU and treated w/ hypertonic saline. Nephrology seeing in consultation. Diarrhea/vomiting resolved. Transitioned to hospitalist service 12/9.   Assessment & Plan:   Principal Problem:   Hyponatremia   # Severe symptomatic hyponatremia Thought to be 2/2 low solute beer potomania. Improved from low 100s to 132 today on salt tablets. Urine output is good. - treatment primarily per nephrology, continuing salt tablets, plan for d/c tomorrow w/o salt tablets - avoiding rapid correction - on 1.5 L fluid restriction  # Debility PT advising HH PT  # Toxic metabolic encephalopathy 2/2 hyponatremia, etoh. Resolved  # Alcoholism Treated initially w/ precedex gtt now weaned off. No signs withdrawal currently - monitor for s/s withdrawal - cont thiamine, folic acid, MV  # Nausea/vomiting/diarrhea Chronic and intermittent s/p LAR; etoh likely also contributes. Resolved since admission to hospital - monitor  # Hypertension Here bp elevated - cont home lisinopril, increase amlodipine to 10  # T2DM Diet controlled, here glucose not elevated - monitor daily fasting sugars  # BPH - home flomax   DVT prophylaxis: lovenox Code Status: full Family Communication: none @ bedside, declines offer to call family  Level of care: Progressive Status is: Inpatient  Remains inpatient appropriate because: severity of illness        Consultants:  nephrology  Procedures: none  Antimicrobials:  none    Subjective: This morning feels well. No n/v/d. No pain. No fever. Tolerating  diet.  Objective: Vitals:   01/02/21 2021 01/03/21 0500 01/03/21 0803 01/03/21 1227  BP: (!) 169/82  (!) 168/76 (!) 167/69  Pulse: 66  61 71  Resp: 17  18 17   Temp: 98.3 F (36.8 C)  (!) 97.3 F (36.3 C) 98.4 F (36.9 C)  TempSrc: Oral  Oral Oral  SpO2: 100%  100% 99%  Weight:  98.6 kg    Height:        Intake/Output Summary (Last 24 hours) at 01/03/2021 1339 Last data filed at 01/03/2021 0810 Gross per 24 hour  Intake 420 ml  Output --  Net 420 ml   Filed Weights   01/01/21 0117 01/02/21 0100 01/03/21 0500  Weight: 99.6 kg 99.6 kg 98.6 kg    Examination:  General exam: Appears calm and comfortable  Respiratory system: Clear to auscultation. Respiratory effort normal. Cardiovascular system: S1 & S2 heard, RRR. No JVD, murmurs, rubs, gallops or clicks. No pedal edema. Gastrointestinal system: Abdomen is nondistended, soft and nontender. No organomegaly or masses felt. Normal bowel sounds heard. Central nervous system: Alert and oriented. No focal neurological deficits. Extremities: Symmetric 5 x 5 power. Skin: No rashes, lesions or ulcers Psychiatry: Judgement and insight appear normal. Mood appears depressed.    Data Reviewed: I have personally reviewed following labs and imaging studies  CBC: Recent Labs  Lab 12/29/20 1132 12/30/20 0420 12/31/20 0504 01/01/21 0133 01/02/21 0156  WBC 18.2* 7.7 8.7 9.0 9.0  NEUTROABS 16.1*  --   --   --   --   HGB 15.1 9.4* 13.6 12.3* 12.4*  HCT RESULTS UNAVAILABLE DUE TO INTERFERING SUBSTANCE RESULTS UNAVAILABLE DUE TO INTERFERING SUBSTANCE 37.0* 33.2* 34.2*  MCV RESULTS UNAVAILABLE  DUE TO INTERFERING SUBSTANCE RESULTS UNAVAILABLE DUE TO INTERFERING SUBSTANCE 85.8 87.1 87.7  PLT 126* 105* 115* 130* 992   Basic Metabolic Panel: Recent Labs  Lab 12/30/20 0420 12/30/20 0834 12/30/20 1208 12/30/20 1604 12/31/20 0504 12/31/20 0610 01/01/21 0133 01/01/21 0553 01/02/21 0156 01/02/21 0541 01/02/21 1300 01/02/21 2052  01/03/21 0614 01/03/21 1250  NA 109*   < > 115*   < > 123*   < > 120*   < > 127* 127* 128* 128* 132* 130*  K 2.9*  --  3.7  --  3.3*  --  3.4*  --  3.3*  --   --   --  4.0  --   CL 75*  --   --   --  88*  --  85*  --  93*  --   --   --  96*  --   CO2 27  --   --   --  27  --  26  --  28  --   --   --  27  --   GLUCOSE 113*  --   --   --  116*  --  137*  --  138*  --   --   --  163*  --   BUN 17  --   --   --  18  --  24*  --  20  --   --   --  20  --   CREATININE 0.84  --   --   --  0.84  --  0.89  --  0.90  --   --   --  0.95  --   CALCIUM 8.0*  --   --   --  8.4*  --  8.3*  --  8.4*  --   --   --  8.8*  --   MG 2.3  --   --   --   --   --  2.1  --   --  2.0  --   --  1.9  --   PHOS 3.0  --   --   --   --   --  3.9  --   --   --   --   --   --   --    < > = values in this interval not displayed.   GFR: Estimated Creatinine Clearance: 85.3 mL/min (by C-G formula based on SCr of 0.95 mg/dL). Liver Function Tests: Recent Labs  Lab 12/29/20 1132 12/31/20 0504  AST 108* 64*  ALT 41 42  ALKPHOS 48 43  BILITOT 1.7* 1.0  PROT 7.0 5.9*  ALBUMIN 4.0 3.4*   Recent Labs  Lab 12/29/20 1132  LIPASE 46   No results for input(s): AMMONIA in the last 168 hours. Coagulation Profile: No results for input(s): INR, PROTIME in the last 168 hours. Cardiac Enzymes: No results for input(s): CKTOTAL, CKMB, CKMBINDEX, TROPONINI in the last 168 hours. BNP (last 3 results) No results for input(s): PROBNP in the last 8760 hours. HbA1C: No results for input(s): HGBA1C in the last 72 hours. CBG: Recent Labs  Lab 12/29/20 2025 12/30/20 2338  GLUCAP 126* 98   Lipid Profile: No results for input(s): CHOL, HDL, LDLCALC, TRIG, CHOLHDL, LDLDIRECT in the last 72 hours. Thyroid Function Tests: No results for input(s): TSH, T4TOTAL, FREET4, T3FREE, THYROIDAB in the last 72 hours. Anemia Panel: No results for input(s): VITAMINB12, FOLATE, FERRITIN, TIBC, IRON, RETICCTPCT in the  last 72 hours. Urine  analysis:    Component Value Date/Time   COLORURINE YELLOW (A) 12/29/2020 1131   APPEARANCEUR CLEAR (A) 12/29/2020 1131   LABSPEC 1.013 12/29/2020 1131   PHURINE 6.0 12/29/2020 1131   GLUCOSEU 150 (A) 12/29/2020 1131   HGBUR MODERATE (A) 12/29/2020 1131   BILIRUBINUR NEGATIVE 12/29/2020 1131   KETONESUR 20 (A) 12/29/2020 1131   PROTEINUR 100 (A) 12/29/2020 1131   NITRITE NEGATIVE 12/29/2020 1131   LEUKOCYTESUR NEGATIVE 12/29/2020 1131   Sepsis Labs: @LABRCNTIP (procalcitonin:4,lacticidven:4)  ) Recent Results (from the past 240 hour(s))  Resp Panel by RT-PCR (Flu A&B, Covid) Nasopharyngeal Swab     Status: None   Collection Time: 12/29/20 11:46 AM   Specimen: Nasopharyngeal Swab; Nasopharyngeal(NP) swabs in vial transport medium  Result Value Ref Range Status   SARS Coronavirus 2 by RT PCR NEGATIVE NEGATIVE Final    Comment: (NOTE) SARS-CoV-2 target nucleic acids are NOT DETECTED.  The SARS-CoV-2 RNA is generally detectable in upper respiratory specimens during the acute phase of infection. The lowest concentration of SARS-CoV-2 viral copies this assay can detect is 138 copies/mL. A negative result does not preclude SARS-Cov-2 infection and should not be used as the sole basis for treatment or other patient management decisions. A negative result may occur with  improper specimen collection/handling, submission of specimen other than nasopharyngeal swab, presence of viral mutation(s) within the areas targeted by this assay, and inadequate number of viral copies(<138 copies/mL). A negative result must be combined with clinical observations, patient history, and epidemiological information. The expected result is Negative.  Fact Sheet for Patients:  EntrepreneurPulse.com.au  Fact Sheet for Healthcare Providers:  IncredibleEmployment.be  This test is no t yet approved or cleared by the [place] FDA and  has been authorized for  detection and/or diagnosis of SARS-CoV-2 by FDA under an Emergency Use Authorization (EUA). This EUA will remain  in effect (meaning this test can be used) for the duration of the COVID-19 declaration under Section 564(b)(1) of the Act, 21 U.S.C.section 360bbb-3(b)(1), unless the authorization is terminated  or revoked sooner.       Influenza A by PCR NEGATIVE NEGATIVE Final   Influenza B by PCR NEGATIVE NEGATIVE Final    Comment: (NOTE) The Xpert Xpress SARS-CoV-2/FLU/RSV plus assay is intended as an aid in the diagnosis of influenza from Nasopharyngeal swab specimens and should not be used as a sole basis for treatment. Nasal washings and aspirates are unacceptable for Xpert Xpress SARS-CoV-2/FLU/RSV testing.  Fact Sheet for Patients: EntrepreneurPulse.com.au  Fact Sheet for Healthcare Providers: IncredibleEmployment.be  This test is not yet approved or cleared by the [place] FDA and has been authorized for detection and/or diagnosis of SARS-CoV-2 by FDA under an Emergency Use Authorization (EUA). This EUA will remain in effect (meaning this test can be used) for the duration of the COVID-19 declaration under Section 564(b)(1) of the Act, 21 U.S.C. section 360bbb-3(b)(1), unless the authorization is terminated or revoked.  Performed at [facility], [address] [place], [place] [address]   MRSA Next Gen by PCR, Nasal     Status: None   Collection Time: 12/29/20  8:26 PM   Specimen: Nasal Mucosa; Nasal Swab  Result Value Ref Range Status   MRSA by PCR Next Gen NOT DETECTED NOT DETECTED Final    Comment: (NOTE) The GeneXpert MRSA Assay (FDA approved for NASAL specimens only), is one component of a comprehensive MRSA colonization surveillance program. It is not intended to diagnose MRSA  infection nor to guide or monitor treatment for MRSA infections. Test performance is not FDA approved in patients less than 72  years old. Performed at [facility], [address], [place], [place] [address]          Radiology Studies: No results found.      Scheduled Meds:  amLODipine  5 mg Oral Daily   Chlorhexidine Gluconate Cloth  6 each Topical Q0600   enoxaparin (LOVENOX) injection  40 mg Subcutaneous QHS   feeding supplement  237 mL Oral TID BM   folic acid  1 mg Oral Daily   lisinopril  40 mg Oral Daily   melatonin  5 mg Oral QHS   multivitamin with minerals  1 tablet Oral Daily   pantoprazole  40 mg Oral BID   sodium chloride  1 g Oral BID WC   tamsulosin  0.8 mg Oral Daily   thiamine injection  100 mg Intravenous Daily   Continuous Infusions:     LOS: 5 days    Time spent: 25 min    Desma Maxim, MD [facility]   If 7PM-7AM, please contact night-coverage [email or web address] Password [email or web address] 01/03/2021, 1:39 PM

## 2021-01-03 NOTE — Plan of Care (Signed)

## 2021-01-03 NOTE — Progress Notes (Signed)
Patient has enteric precautions in place due to repeating episodes of diarrhea prior to admittance into hospital. Concerning for Cdiff. Unable to obtain stool sample at this time due to patient not having BM since admission per patient himself.

## 2021-01-04 LAB — BASIC METABOLIC PANEL
Anion gap: 7 (ref 5–15)
BUN: 18 mg/dL (ref 8–23)
CO2: 28 mmol/L (ref 22–32)
Calcium: 8.5 mg/dL — ABNORMAL LOW (ref 8.9–10.3)
Chloride: 96 mmol/L — ABNORMAL LOW (ref 98–111)
Creatinine, Ser: 0.84 mg/dL (ref 0.61–1.24)
GFR, Estimated: >60 mL/min (ref >60)
Glucose, Bld: 175 mg/dL — ABNORMAL HIGH (ref 70–99)
Potassium: 4.1 mmol/L (ref 3.5–5.1)
Sodium: 131 mmol/L — ABNORMAL LOW (ref 135–145)

## 2021-01-04 LAB — MAGNESIUM: Magnesium: 1.9 mg/dL (ref 1.7–2.4)

## 2021-01-04 MED ORDER — AMLODIPINE BESYLATE 10 MG PO TABS: 5.0000 mg | ORAL_TABLET | Freq: Every day | ORAL | 1 refills | Status: AC

## 2021-01-04 NOTE — Progress Notes (Signed)
[facility]  ROUNDING NOTE   Subjective:   Patient found sitting on a recliner near the bed, in no acute distress, appears pleasant, reports fatigue is better today, awaiting discharge home.  Objective:  Vital signs in last 24 hours:  Temp:  [97.4 F (36.3 C)-98.9 F (37.2 C)] 97.4 F (36.3 C) (12/11 0806) Pulse Rate:  [57-69] 64 (12/11 0807) Resp:  [18-20] 18 (12/11 0310) BP: (154-179)/(66-82) 179/76 (12/11 0807) SpO2:  [99 %-100 %] 99 % (12/11 0806) Weight:  [97.9 kg] 97.9 kg (12/11 0312)  Weight change: -0.668 kg Filed Weights   01/02/21 0100 01/03/21 0500 01/04/21 0312  Weight: 99.6 kg 98.6 kg 97.9 kg    Intake/Output: I/O last 3 completed shifts: In: 120 [P.O.:120] Out: -    Intake/Output this shift:  No intake/output data recorded.  Physical Exam: General: In no acute distress  Head: Normocephalic, atraumatic  Lungs:  Respirations even, unlabored, lungs clear  Heart: S1S2, no rubs or gallops  Abdomen:  Non distended  Extremities:  No peripheral edema.  Neurologic: Awake,alert, oriented  Skin: No acute lesions or rashes        Basic Metabolic Panel: Recent Labs  Lab 12/30/20 0420 12/30/20 0834 12/31/20 0504 12/31/20 0610 01/01/21 0133 01/01/21 0553 01/02/21 0156 01/02/21 0541 01/02/21 1300 01/02/21 2052 01/03/21 0614 01/03/21 1250 01/04/21 0503  NA 109*   < > 123*   < > 120*   < > 127* 127* 128* 128* 132* 130* 131*  K 2.9*   < > 3.3*  --  3.4*  --  3.3*  --   --   --  4.0  --  4.1  CL 75*  --  88*  --  85*  --  93*  --   --   --  96*  --  96*  CO2 27  --  27  --  26  --  28  --   --   --  27  --  28  GLUCOSE 113*  --  116*  --  137*  --  138*  --   --   --  163*  --  175*  BUN 17  --  18  --  24*  --  20  --   --   --  20  --  18  CREATININE 0.84  --  0.84  --  0.89  --  0.90  --   --   --  0.95  --  0.84  CALCIUM 8.0*  --  8.4*  --  8.3*  --  8.4*  --   --   --  8.8*  --  8.5*  MG 2.3  --   --   --  2.1  --   --  2.0  --   --   1.9  --  1.9  PHOS 3.0  --   --   --  3.9  --   --   --   --   --   --   --   --    < > = values in this interval not displayed.     Liver Function Tests: Recent Labs  Lab 12/29/20 1132 12/31/20 0504  AST 108* 64*  ALT 41 42  ALKPHOS 48 43  BILITOT 1.7* 1.0  PROT 7.0 5.9*  ALBUMIN 4.0 3.4*    Recent Labs  Lab 12/29/20 1132  LIPASE 46    No results for input(s): AMMONIA in the  last 168 hours.  CBC: Recent Labs  Lab 12/29/20 1132 12/30/20 0420 12/31/20 0504 01/01/21 0133 01/02/21 0156  WBC 18.2* 7.7 8.7 9.0 9.0  NEUTROABS 16.1*  --   --   --   --   HGB 15.1 9.4* 13.6 12.3* 12.4*  HCT RESULTS UNAVAILABLE DUE TO INTERFERING SUBSTANCE RESULTS UNAVAILABLE DUE TO INTERFERING SUBSTANCE 37.0* 33.2* 34.2*  MCV RESULTS UNAVAILABLE DUE TO INTERFERING SUBSTANCE RESULTS UNAVAILABLE DUE TO INTERFERING SUBSTANCE 85.8 87.1 87.7  PLT 126* 105* 115* 130* 156     Cardiac Enzymes: No results for input(s): CKTOTAL, CKMB, CKMBINDEX, TROPONINI in the last 168 hours.  BNP: Invalid input(s): POCBNP  CBG: Recent Labs  Lab 12/29/20 2025 12/30/20 2338  GLUCAP 126* 98     Microbiology: Results for orders placed or performed during the hospital encounter of 12/29/20  Resp Panel by RT-PCR (Flu A&B, Covid) Nasopharyngeal Swab     Status: None   Collection Time: 12/29/20 11:46 AM   Specimen: Nasopharyngeal Swab; Nasopharyngeal(NP) swabs in vial transport medium  Result Value Ref Range Status   SARS Coronavirus 2 by RT PCR NEGATIVE NEGATIVE Final    Comment: (NOTE) SARS-CoV-2 target nucleic acids are NOT DETECTED.  The SARS-CoV-2 RNA is generally detectable in upper respiratory specimens during the acute phase of infection. The lowest concentration of SARS-CoV-2 viral copies this assay can detect is 138 copies/mL. A negative result does not preclude SARS-Cov-2 infection and should not be used as the sole basis for treatment or other patient management decisions. A negative  result may occur with  improper specimen collection/handling, submission of specimen other than nasopharyngeal swab, presence of viral mutation(s) within the areas targeted by this assay, and inadequate number of viral copies(<138 copies/mL). A negative result must be combined with clinical observations, patient history, and epidemiological information. The expected result is Negative.  Fact Sheet for Patients:  EntrepreneurPulse.com.au  Fact Sheet for Healthcare Providers:  IncredibleEmployment.be  This test is no t yet approved or cleared by the [place] FDA and  has been authorized for detection and/or diagnosis of SARS-CoV-2 by FDA under an Emergency Use Authorization (EUA). This EUA will remain  in effect (meaning this test can be used) for the duration of the COVID-19 declaration under Section 564(b)(1) of the Act, 21 U.S.C.section 360bbb-3(b)(1), unless the authorization is terminated  or revoked sooner.       Influenza A by PCR NEGATIVE NEGATIVE Final   Influenza B by PCR NEGATIVE NEGATIVE Final    Comment: (NOTE) The Xpert Xpress SARS-CoV-2/FLU/RSV plus assay is intended as an aid in the diagnosis of influenza from Nasopharyngeal swab specimens and should not be used as a sole basis for treatment. Nasal washings and aspirates are unacceptable for Xpert Xpress SARS-CoV-2/FLU/RSV testing.  Fact Sheet for Patients: EntrepreneurPulse.com.au  Fact Sheet for Healthcare Providers: IncredibleEmployment.be  This test is not yet approved or cleared by the [place] FDA and has been authorized for detection and/or diagnosis of SARS-CoV-2 by FDA under an Emergency Use Authorization (EUA). This EUA will remain in effect (meaning this test can be used) for the duration of the COVID-19 declaration under Section 564(b)(1) of the Act, 21 U.S.C. section 360bbb-3(b)(1), unless the authorization is  terminated or revoked.  Performed at [facility], [address] [place], [place] [address]   MRSA Next Gen by PCR, Nasal     Status: None   Collection Time: 12/29/20  8:26 PM   Specimen: Nasal Mucosa; Nasal Swab  Result Value  Ref Range Status   MRSA by PCR Next Gen NOT DETECTED NOT DETECTED Final    Comment: (NOTE) The GeneXpert MRSA Assay (FDA approved for NASAL specimens only), is one component of a comprehensive MRSA colonization surveillance program. It is not intended to diagnose MRSA infection nor to guide or monitor treatment for MRSA infections. Test performance is not FDA approved in patients less than 96 years old. Performed at [facility], [address] [place], [place] [address]     Coagulation Studies: No results for input(s): LABPROT, INR in the last 72 hours.  Urinalysis: No results for input(s): COLORURINE, LABSPEC, PHURINE, GLUCOSEU, HGBUR, BILIRUBINUR, KETONESUR, PROTEINUR, UROBILINOGEN, NITRITE, LEUKOCYTESUR in the last 72 hours.  Invalid input(s): APPERANCEUR     Imaging: No results found.   Medications:      amLODipine  10 mg Oral Daily   Chlorhexidine Gluconate Cloth  6 each Topical Q0600   enoxaparin (LOVENOX) injection  40 mg Subcutaneous QHS   feeding supplement  237 mL Oral TID BM   folic acid  1 mg Oral Daily   lisinopril  40 mg Oral Daily   melatonin  5 mg Oral QHS   multivitamin with minerals  1 tablet Oral Daily   pantoprazole  40 mg Oral BID   sodium chloride  1 g Oral BID WC   tamsulosin  0.8 mg Oral Daily   thiamine injection  100 mg Intravenous Daily   acetaminophen, docusate sodium, ondansetron (ZOFRAN) IV, polyethylene glycol  Assessment/ Plan:  Mr. Dash Cardarelli is a 70 y.o. white male with hypertension, alcohol abuse, diabetes mellitus type II, neuropathy, anxiety, depression, history of rectal cancer, who was admitted to [facility] on 12/29/2020 for Alcohol abuse [F10.10] Hyponatremia [E87.1] Altered  mental status, unspecified altered mental status type [R41.82]  # Hyponatremia:  Sodium was critically low on admission secondary to poor solute diet (Beer potomania)  Na+131 today,staying relatively stable for the past 3 days Reinforced adequate oral intake, avoiding alcohol and follow up with PCP    LOS: 6 Gursimran Litaker 12/11/20223:12 PM

## 2021-01-04 NOTE — Discharge Summary (Signed)
Trevor Meyers CHE:[id number] DOB: May 05, 1950 DOA: 12/29/2020  PCP: Pcp, No  Admit date: 12/29/2020 Discharge date: 01/04/2021  Time spent: 45 minutes  Recommendations for Outpatient Follow-up:  Follow-up with the illustrious Dr. Hope Pigeon at the [facility] Attention to sodium, glucose, and blood pressure at that follow-up      Discharge Diagnoses:  Principal Problem:   Hyponatremia   Discharge Condition: stable  Diet recommendation: regular  Filed Weights   01/02/21 0100 01/03/21 0500 01/04/21 0312  Weight: 99.6 kg 98.6 kg 97.9 kg    History of present illness:  Trevor Meyers is a 70 year old male with a past medical history significant for alcohol abuse (reports 6-8 beers per day), hyponatremia requiring hospitalization, hypertension, diabetes mellitus who presented to [facility] ED on 12/29/2020 due to complaints of generalized weakness status post fall at home, nausea, vomiting, diarrhea.   Patient is currently confused and seems to be exhibiting signs of developing DT's upon my examination the ED, however no focal neuro deficits noted and patient is able to contribute to history. He reports about a 1 week history of persistent nausea, vomiting (non bloody), diarrhea (non bloody). He denies chest pain,  palpitations, dizziness, cough, shortness of breath, fever, chills, dysuria, abdominal pain, hematemesis or hematochezia.  Over the past 3 days he began to have progressive weakness and began to feel unsteady on his feet, and reported he had a fall this morning and hit his head.  Upon EMS arrival he was noted to be slightly disoriented.  EMS gave a 500 cc bolus of normal saline.  Hospital Course:  Patient presented with severe symptomatic hyponatremia in the setting of alcohol abuse, poor PO intake, and nausea/vomiting. Nausea/vomiting resolved with cessation of alcohol and improvement in serum sodium. Patient was admitted to the ICU and treated with hypertonic saline.  Nephrology was consulted. Hypertonic saline was transitioned to oral salt tablets. Sodium improved from less than 102 (102 is lowest limit our lab detects) to corrected value of 132 on day of discharge and nephrology deemed appropriate for discharge. Patient is aware that cessation of alcohol and regular diet are keys to maintaining normal sodium level. Patient exhibited mild symptoms of withdrawal which resolved. Given active alcohol abuse we advise holding metformin at discharge. BP was also elevated during hospitalization and home amlodipine was increased; will need attention to this as well at PCP f/u.   Procedures: none   Consultations: nephrology  Discharge Exam: Vitals:   01/04/21 0806 01/04/21 0807  BP: (!) 178/82 (!) 179/76  Pulse: 63 64  Resp:    Temp: (!) 97.4 F (36.3 C)   SpO2: 99%     General exam: Appears calm and comfortable  Respiratory system: Clear to auscultation. Respiratory effort normal. Cardiovascular system: S1 & S2 heard, RRR. No JVD, murmurs, rubs, gallops or clicks. No pedal edema. Gastrointestinal system: Abdomen is nondistended, soft and nontender. No organomegaly or masses felt. Normal bowel sounds heard. Central nervous system: Alert and oriented. No focal neurological deficits. Extremities: Symmetric 5 x 5 power. Skin: No rashes, lesions or ulcers Psychiatry: Judgement and insight appear normal. Mood appears depressed.  Discharge Instructions   Discharge Instructions     Diet general   Complete by: As directed    Increase activity slowly   Complete by: As directed       Allergies as of 01/04/2021   Not on File      Medication List     STOP taking these medications  metFORMIN 1000 MG tablet Commonly known as: GLUCOPHAGE       TAKE these medications    amLODipine 10 MG tablet Commonly known as: NORVASC Take 0.5 tablets (5 mg total) by mouth daily. What changed: medication strength   lisinopril 40 MG tablet Commonly known  as: ZESTRIL Take 40 mg by mouth daily.   lovastatin 40 MG tablet Commonly known as: MEVACOR Take 40 mg by mouth daily.   tamsulosin 0.4 MG Caps capsule Commonly known as: FLOMAX Take 0.8 mg by mouth daily.       Not on File  Follow-up Information     Emelia Salisbury, MD Follow up.   Specialty: Student Contact information: [id number] [facility] [id number] [phone]                  The results of significant diagnostics from this hospitalization (including imaging, microbiology, ancillary and laboratory) are listed below for reference.    Significant Diagnostic Studies: DG Chest 2 View  Result Date: 12/29/2020 CLINICAL DATA:  Altered mental status EXAM: CHEST - 2 VIEW COMPARISON:  None. FINDINGS: Lateral view degraded by patient arm position. Midline trachea. Normal heart size and mediastinal contours. No pleural effusion or pneumothorax. Clear lungs. IMPRESSION: No active cardiopulmonary disease. Electronically Signed   By: Abigail Miyamoto M.D.   On: 12/29/2020 12:33   CT Head Wo Contrast  Result Date: 12/29/2020 CLINICAL DATA:  70 year old male with history of mental status change. Weakness. EXAM: CT HEAD WITHOUT CONTRAST CT CERVICAL SPINE WITHOUT CONTRAST TECHNIQUE: Multidetector CT imaging of the head and cervical spine was performed following the standard protocol without intravenous contrast. Multiplanar CT image reconstructions of the cervical spine were also generated. COMPARISON:  No priors. FINDINGS: CT HEAD FINDINGS Brain: Mild cerebral atrophy. Patchy and confluent areas of decreased attenuation are noted throughout the deep and periventricular white matter of the cerebral hemispheres bilaterally, compatible with chronic microvascular ischemic disease. No evidence of acute infarction, hemorrhage, hydrocephalus, extra-axial collection or mass lesion/mass effect. Vascular: No hyperdense vessel or unexpected calcification.  Skull: Normal. Negative for fracture or focal lesion. Sinuses/Orbits: No acute finding. Other: None. CT CERVICAL SPINE FINDINGS Alignment: Normal. Skull base and vertebrae: No acute fracture. No primary bone lesion or focal pathologic process. Soft tissues and spinal canal: No prevertebral fluid or swelling. No visible canal hematoma. Disc levels: Multilevel degenerative disc disease, most severe at C5-C6 and C6-C7. Mild multilevel facet arthropathy. Upper chest: Negative. Other: None. IMPRESSION: 1. No acute intracranial abnormalities. 2. No acute abnormality of the cervical spine. 3. Mild cerebral atrophy with chronic microvascular ischemic changes in the cerebral white matter, as above. 4. Mild multilevel degenerative disc disease and cervical spondylosis, as above. Electronically Signed   By: Vinnie Langton M.D.   On: 12/29/2020 12:34   CT Cervical Spine Wo Contrast  Result Date: 12/29/2020 CLINICAL DATA:  70 year old male with history of mental status change. Weakness. EXAM: CT HEAD WITHOUT CONTRAST CT CERVICAL SPINE WITHOUT CONTRAST TECHNIQUE: Multidetector CT imaging of the head and cervical spine was performed following the standard protocol without intravenous contrast. Multiplanar CT image reconstructions of the cervical spine were also generated. COMPARISON:  No priors. FINDINGS: CT HEAD FINDINGS Brain: Mild cerebral atrophy. Patchy and confluent areas of decreased attenuation are noted throughout the deep and periventricular white matter of the cerebral hemispheres bilaterally, compatible with chronic microvascular ischemic disease. No evidence of acute infarction, hemorrhage, hydrocephalus, extra-axial collection or mass lesion/mass effect.  Vascular: No hyperdense vessel or unexpected calcification. Skull: Normal. Negative for fracture or focal lesion. Sinuses/Orbits: No acute finding. Other: None. CT CERVICAL SPINE FINDINGS Alignment: Normal. Skull base and vertebrae: No acute fracture. No  primary bone lesion or focal pathologic process. Soft tissues and spinal canal: No prevertebral fluid or swelling. No visible canal hematoma. Disc levels: Multilevel degenerative disc disease, most severe at C5-C6 and C6-C7. Mild multilevel facet arthropathy. Upper chest: Negative. Other: None. IMPRESSION: 1. No acute intracranial abnormalities. 2. No acute abnormality of the cervical spine. 3. Mild cerebral atrophy with chronic microvascular ischemic changes in the cerebral white matter, as above. 4. Mild multilevel degenerative disc disease and cervical spondylosis, as above. Electronically Signed   By: Vinnie Langton M.D.   On: 12/29/2020 12:34    Microbiology: Recent Results (from the past 240 hour(s))  Resp Panel by RT-PCR (Flu A&B, Covid) Nasopharyngeal Swab     Status: None   Collection Time: 12/29/20 11:46 AM   Specimen: Nasopharyngeal Swab; Nasopharyngeal(NP) swabs in vial transport medium  Result Value Ref Range Status   SARS Coronavirus 2 by RT PCR NEGATIVE NEGATIVE Final    Comment: (NOTE) SARS-CoV-2 target nucleic acids are NOT DETECTED.  The SARS-CoV-2 RNA is generally detectable in upper respiratory specimens during the acute phase of infection. The lowest concentration of SARS-CoV-2 viral copies this assay can detect is 138 copies/mL. A negative result does not preclude SARS-Cov-2 infection and should not be used as the sole basis for treatment or other patient management decisions. A negative result may occur with  improper specimen collection/handling, submission of specimen other than nasopharyngeal swab, presence of viral mutation(s) within the areas targeted by this assay, and inadequate number of viral copies(<138 copies/mL). A negative result must be combined with clinical observations, patient history, and epidemiological information. The expected result is Negative.  Fact Sheet for Patients:  EntrepreneurPulse.com.au  Fact Sheet for Healthcare  Providers:  IncredibleEmployment.be  This test is no t yet approved or cleared by the [place] FDA and  has been authorized for detection and/or diagnosis of SARS-CoV-2 by FDA under an Emergency Use Authorization (EUA). This EUA will remain  in effect (meaning this test can be used) for the duration of the COVID-19 declaration under Section 564(b)(1) of the Act, 21 U.S.C.section 360bbb-3(b)(1), unless the authorization is terminated  or revoked sooner.       Influenza A by PCR NEGATIVE NEGATIVE Final   Influenza B by PCR NEGATIVE NEGATIVE Final    Comment: (NOTE) The Xpert Xpress SARS-CoV-2/FLU/RSV plus assay is intended as an aid in the diagnosis of influenza from Nasopharyngeal swab specimens and should not be used as a sole basis for treatment. Nasal washings and aspirates are unacceptable for Xpert Xpress SARS-CoV-2/FLU/RSV testing.  Fact Sheet for Patients: EntrepreneurPulse.com.au  Fact Sheet for Healthcare Providers: IncredibleEmployment.be  This test is not yet approved or cleared by the [place] FDA and has been authorized for detection and/or diagnosis of SARS-CoV-2 by FDA under an Emergency Use Authorization (EUA). This EUA will remain in effect (meaning this test can be used) for the duration of the COVID-19 declaration under Section 564(b)(1) of the Act, 21 U.S.C. section 360bbb-3(b)(1), unless the authorization is terminated or revoked.  Performed at [facility], [address] [place], [place] [address]   MRSA Next Gen by PCR, Nasal     Status: None   Collection Time: 12/29/20  8:26 PM   Specimen: Nasal Mucosa; Nasal Swab  Result Value Ref  Range Status   MRSA by PCR Next Gen NOT DETECTED NOT DETECTED Final    Comment: (NOTE) The GeneXpert MRSA Assay (FDA approved for NASAL specimens only), is one component of a comprehensive MRSA colonization surveillance program. It is not  intended to diagnose MRSA infection nor to guide or monitor treatment for MRSA infections. Test performance is not FDA approved in patients less than 46 years old. Performed at [facility], [address] [place], [place] [address]      Labs: Basic Metabolic Panel: Recent Labs  Lab 12/30/20 0420 12/30/20 1624 12/31/20 0504 12/31/20 0610 01/01/21 0133 01/01/21 0553 01/02/21 0156 01/02/21 0541 01/02/21 1300 01/02/21 2052 01/03/21 0614 01/03/21 1250 01/04/21 0503  NA 109*   < > 123*   < > 120*   < > 127* 127* 128* 128* 132* 130* 131*  K 2.9*   < > 3.3*  --  3.4*  --  3.3*  --   --   --  4.0  --  4.1  CL 75*  --  88*  --  85*  --  93*  --   --   --  96*  --  96*  CO2 27  --  27  --  26  --  28  --   --   --  27  --  28  GLUCOSE 113*  --  116*  --  137*  --  138*  --   --   --  163*  --  175*  BUN 17  --  18  --  24*  --  20  --   --   --  20  --  18  CREATININE 0.84  --  0.84  --  0.89  --  0.90  --   --   --  0.95  --  0.84  CALCIUM 8.0*  --  8.4*  --  8.3*  --  8.4*  --   --   --  8.8*  --  8.5*  MG 2.3  --   --   --  2.1  --   --  2.0  --   --  1.9  --  1.9  PHOS 3.0  --   --   --  3.9  --   --   --   --   --   --   --   --    < > = values in this interval not displayed.   Liver Function Tests: Recent Labs  Lab 12/29/20 1132 12/31/20 0504  AST 108* 64*  ALT 41 42  ALKPHOS 48 43  BILITOT 1.7* 1.0  PROT 7.0 5.9*  ALBUMIN 4.0 3.4*   Recent Labs  Lab 12/29/20 1132  LIPASE 46   No results for input(s): AMMONIA in the last 168 hours. CBC: Recent Labs  Lab 12/29/20 1132 12/30/20 0420 12/31/20 0504 01/01/21 0133 01/02/21 0156  WBC 18.2* 7.7 8.7 9.0 9.0  NEUTROABS 16.1*  --   --   --   --   HGB 15.1 9.4* 13.6 12.3* 12.4*  HCT RESULTS UNAVAILABLE DUE TO INTERFERING SUBSTANCE RESULTS UNAVAILABLE DUE TO INTERFERING SUBSTANCE 37.0* 33.2* 34.2*  MCV RESULTS UNAVAILABLE DUE TO INTERFERING SUBSTANCE RESULTS UNAVAILABLE DUE TO INTERFERING SUBSTANCE 85.8  87.1 87.7  PLT 126* 105* 115* 130* 156   Cardiac Enzymes: No results for input(s): CKTOTAL, CKMB, CKMBINDEX, TROPONINI in the last 168 hours. BNP: BNP (last 3 results) No results for input(s):  BNP in the last 8760 hours.  ProBNP (last 3 results) No results for input(s): PROBNP in the last 8760 hours.  CBG: Recent Labs  Lab 12/29/20 2025 12/30/20 2338  GLUCAP 126* 98       Signed:  Desma Maxim MD.  [facility] 01/04/2021, 9:37 AM

## 2021-01-04 NOTE — TOC Progression Note (Signed)
Transition of Care [facility]) - Progression Note    Patient Details  Name: Trevor Meyers MRN: [id number] Date of Birth: 12-18-1950  [facility] CM/SW Contact  Izola Price, RN Phone Number: 01/04/2021, 10:10 AM  Clinical Narrative:   12/11: Patient is being discharged to Home today and is currently refusing [facility] services. DME ordered by TOC CM on 12/8 has not yet been delivered per RN and per [facility] at [place], it was not on their list. DME orders not written as yet. [employer] will expedite RW to [facility] to patient. Patient will need a ride home. Printed a rider waiver to unit to have unit RN have patient sign and will arrange ride later today. Simmie Davies RN CM      Expected Discharge Plan: [place] Barriers to Discharge: Continued Medical Work up  Expected Discharge Plan and Services Expected Discharge Plan: [place]   Discharge Planning Services: CM Consult Post Acute Care Choice: [place] arrangements for the past 2 months: Single Family Home Expected Discharge Date: 01/04/21               DME Arranged: Gilford Rile DME Agency: [employer]       HH Arranged: PT, OT HH Agency: [facility] Date [place]: 01/01/21 Time [place]: 6962 Representative spoke with at [place]: [place]   Social Determinants of Health ([place]) Interventions    Readmission Risk Interventions No flowsheet data found.

## 2021-11-06 ENCOUNTER — Other Ambulatory Visit: Payer: Self-pay | Admitting: Obstetrics and Gynecology

## 2023-07-12 IMAGING — CT CT CERVICAL SPINE W/O CM
3 of 4 series · 9 of 33 positions shown, 10 images · non-contrast
Comparison: No priors.

CLINICAL DATA: 70-year-old male with history of mental status
change. Weakness.

EXAM:
CT HEAD WITHOUT CONTRAST
CT CERVICAL SPINE WITHOUT CONTRAST
TECHNIQUE: Multidetector CT imaging of the head and cervical spine was
performed following the standard protocol without intravenous
contrast. Multiplanar CT image reconstructions of the cervical spine
were also generated.

[Series 6: sagittal bone · sagittal · 0.23mm/px · 5 of 64 slices shown]
[im 22/64  bone]
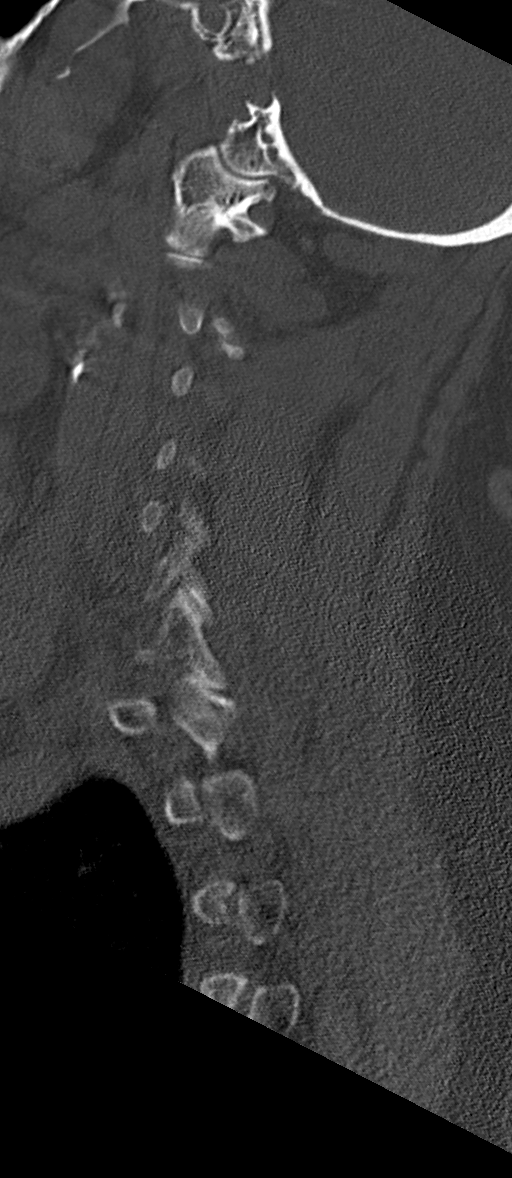
[im 27/64  bone]
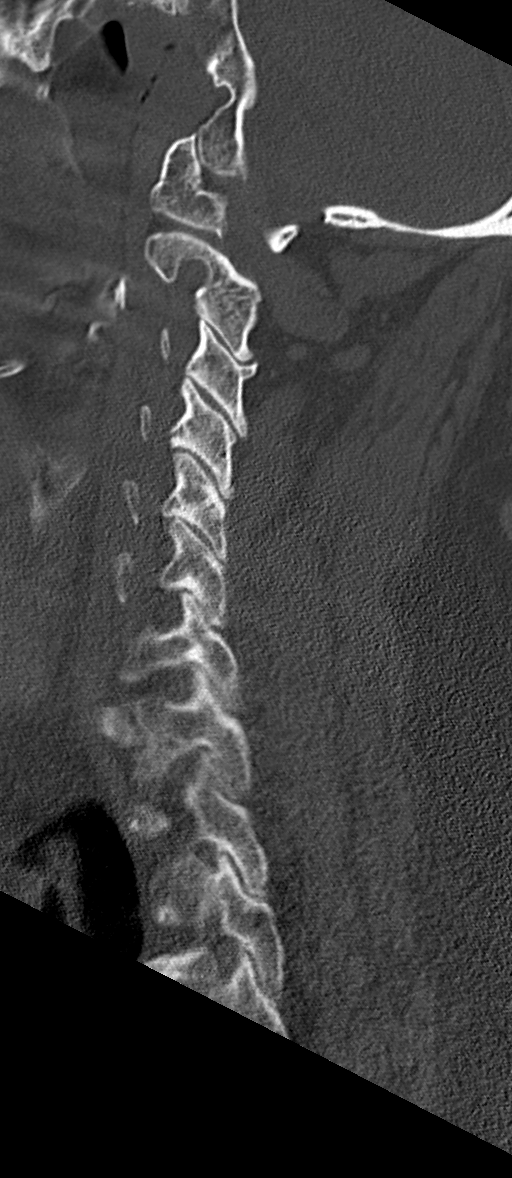
[im 32/64  bone]
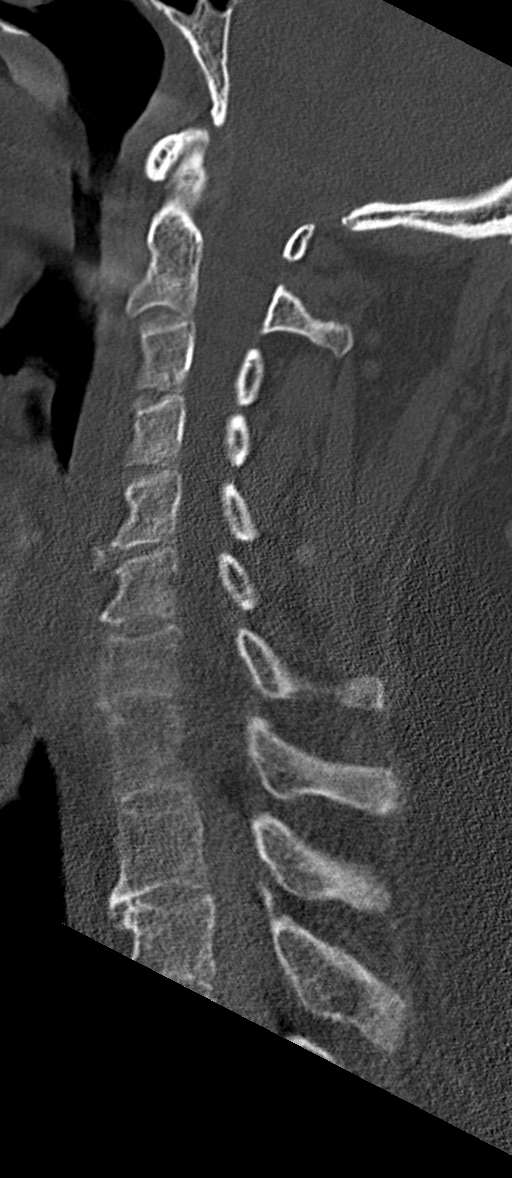
[im 37/64  bone]
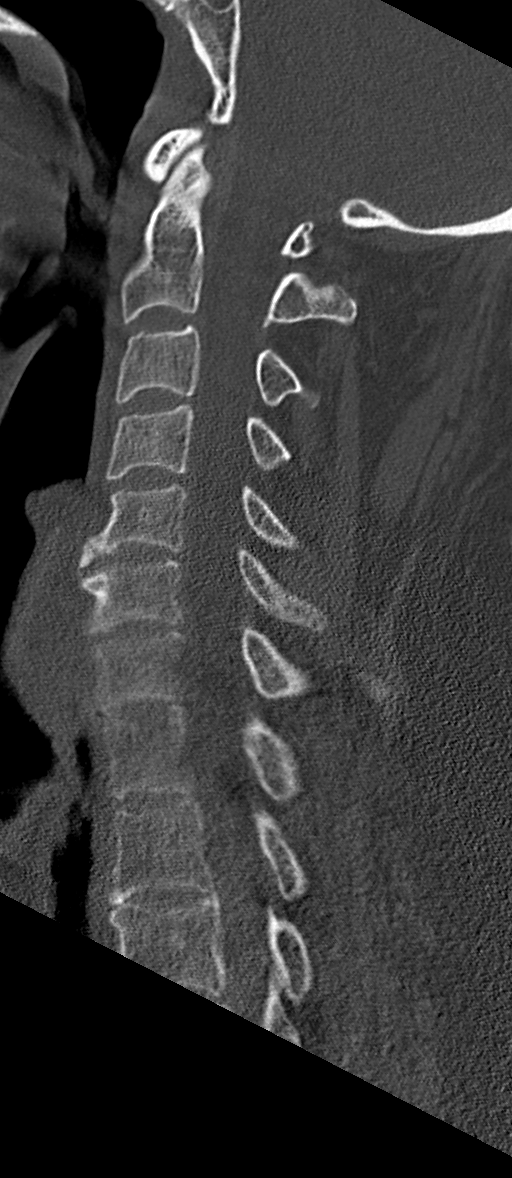
[im 43/64  bone]
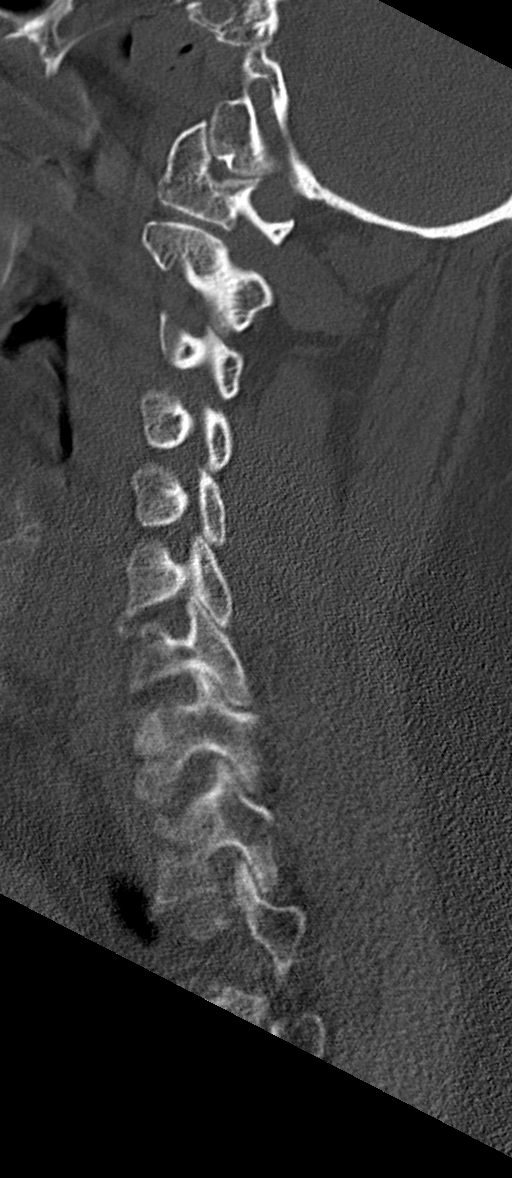

[Series 7: coronal bone · coronal · 0.27mm/px · 3 of 48 slices shown]
[im 10/48  bone]
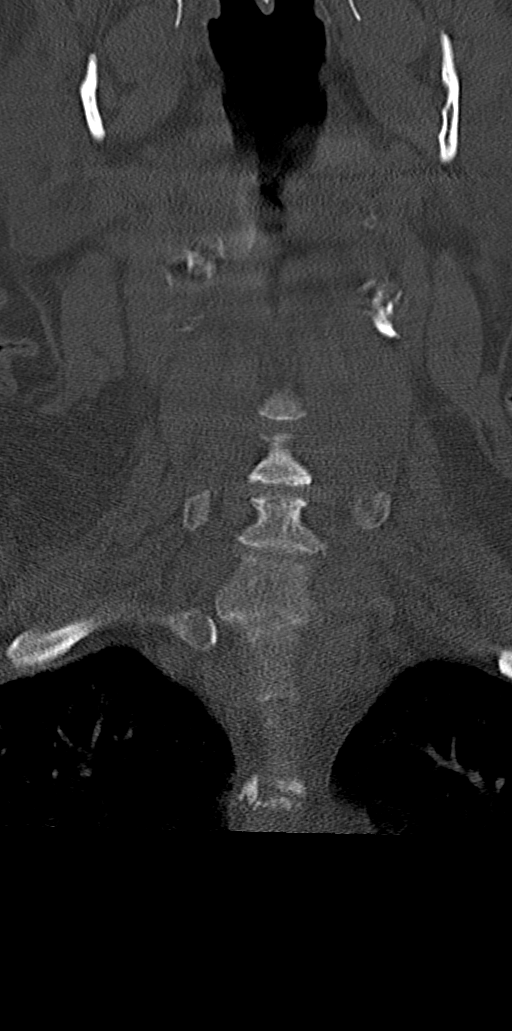
[im 19/48  bone]
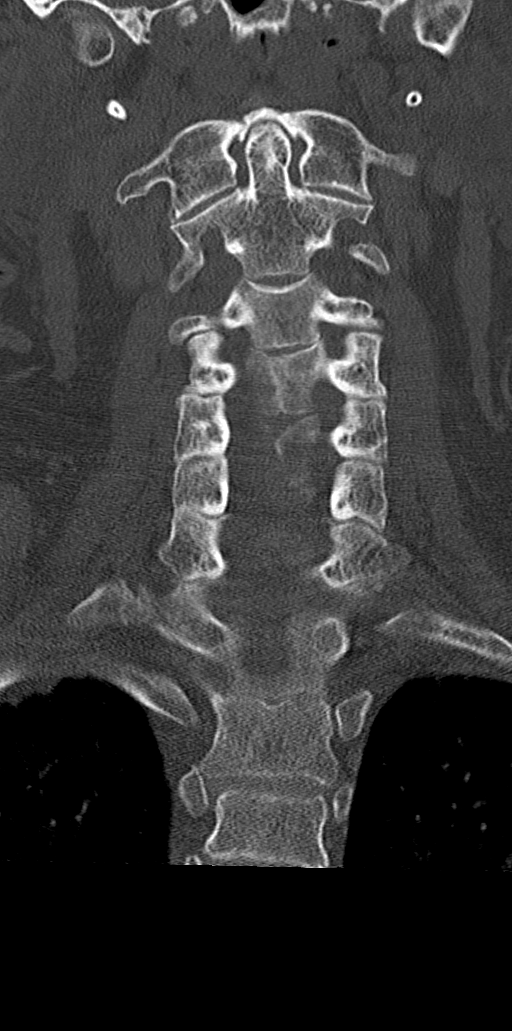
[im 29/48  bone]
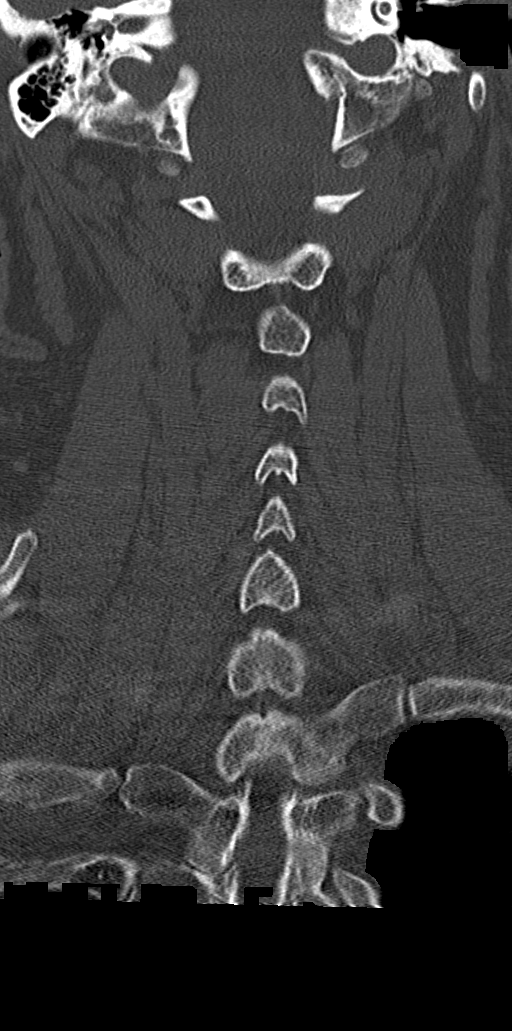

[Series 8: orthogonal bone · axial · 0.23mm/px · z∈[-280,-280]mm · 1 of 133 slices shown, 2 images]
[im 76/133  soft-tissue]
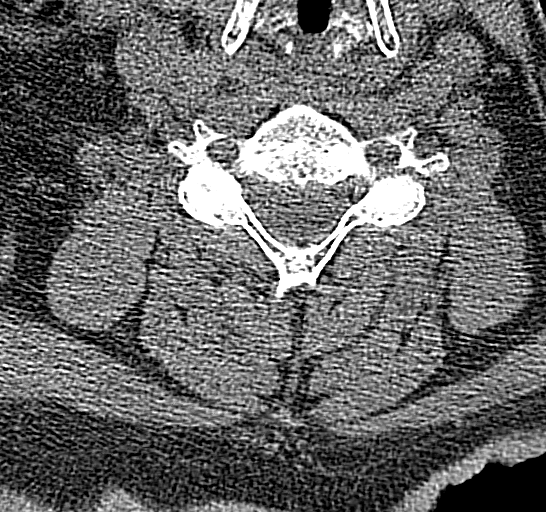
[im 76/133  bone]
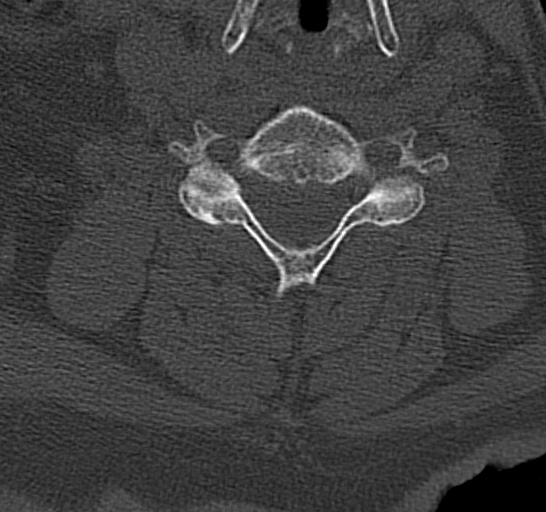

[9 of 33 positions shown; findings below may reference images not displayed]

FINDINGS: CT HEAD FINDINGS

Brain: Mild cerebral atrophy. Patchy and confluent areas of
decreased attenuation are noted throughout the deep and
periventricular white matter of the cerebral hemispheres
bilaterally, compatible with chronic microvascular ischemic disease.
No evidence of acute infarction, hemorrhage, hydrocephalus,
extra-axial collection or mass lesion/mass effect.

Vascular: No hyperdense vessel or unexpected calcification.

Skull: Normal. Negative for fracture or focal lesion.

Sinuses/Orbits: No acute finding.

Other: None.

CT CERVICAL SPINE FINDINGS

Alignment: Normal.

Skull base and vertebrae: No acute fracture. No primary bone lesion
or focal pathologic process.

Soft tissues and spinal canal: No prevertebral fluid or swelling. No
visible canal hematoma.

Disc levels: Multilevel degenerative disc disease, most severe at
C5-C6 and C6-C7. Mild multilevel facet arthropathy.

Upper chest: Negative.

Other: None.
IMPRESSION: 1. No acute intracranial abnormalities.
2. No acute abnormality of the cervical spine.
3. Mild cerebral atrophy with chronic microvascular ischemic changes
in the cerebral white matter, as above.
4. Mild multilevel degenerative disc disease and cervical
spondylosis, as above.

## 2023-07-12 IMAGING — CT CT HEAD W/O CM
4 series · 16 of 47 positions shown, 18 images · non-contrast
Comparison: No priors.

CLINICAL DATA: 70-year-old male with history of mental status
change. Weakness.

EXAM:
CT HEAD WITHOUT CONTRAST
CT CERVICAL SPINE WITHOUT CONTRAST
TECHNIQUE: Multidetector CT imaging of the head and cervical spine was
performed following the standard protocol without intravenous
contrast. Multiplanar CT image reconstructions of the cervical spine
were also generated.

[Series 2: head bone · axial · 0.44mm/px · z∈[-153,-121]mm · 3 of 83 slices shown]
[im 9/83  bone]
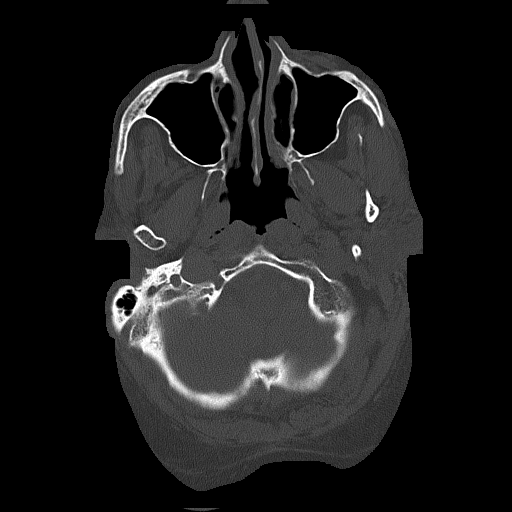
[im 17/83  bone]
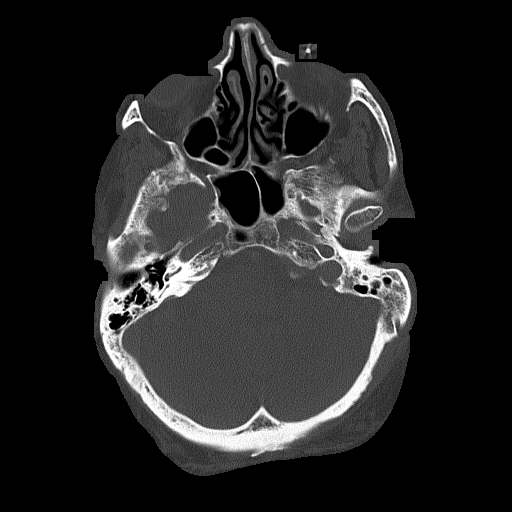
[im 25/83  bone]
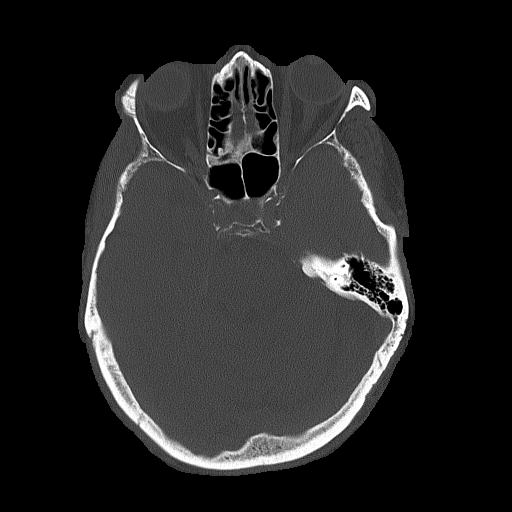

[Series 3: head wo · axial · 0.44mm/px · z∈[-149,-29]mm · 7 of 33 slices shown, 9 images]
[im 5/33  brain]
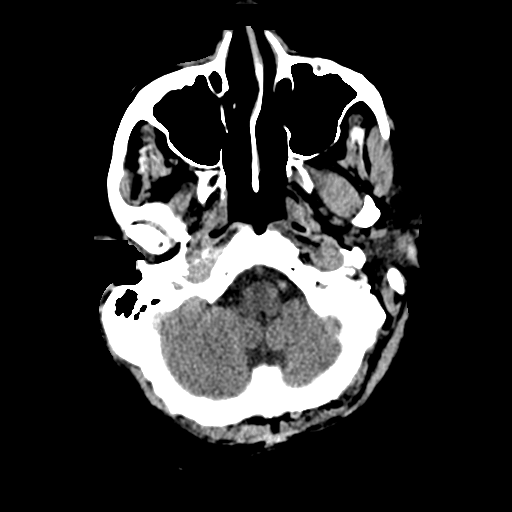
[im 5/33  bone]
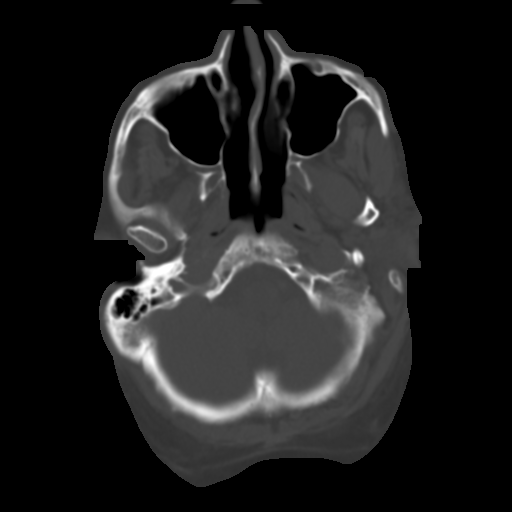
[im 9/33  brain]
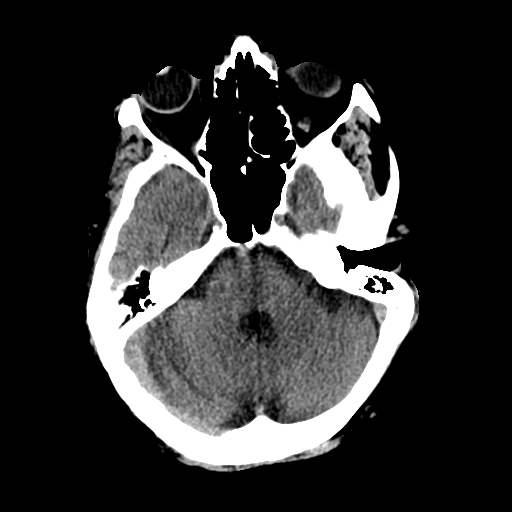
[im 13/33  brain]
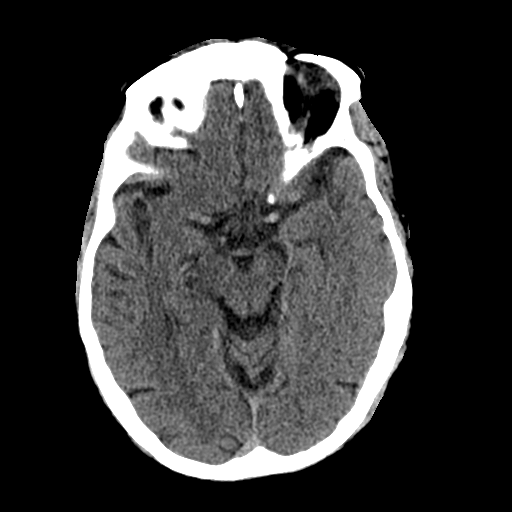
[im 17/33  brain]
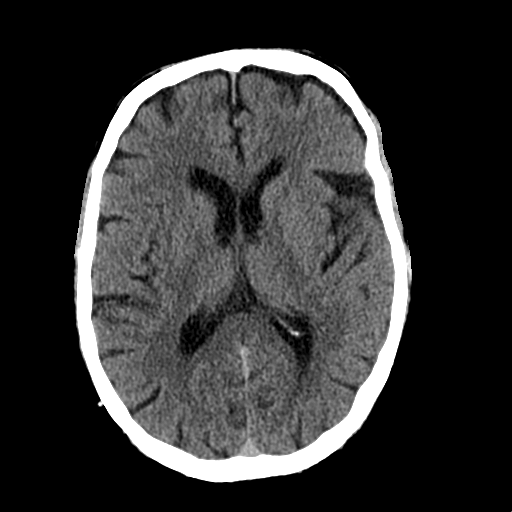
[im 21/33  brain]
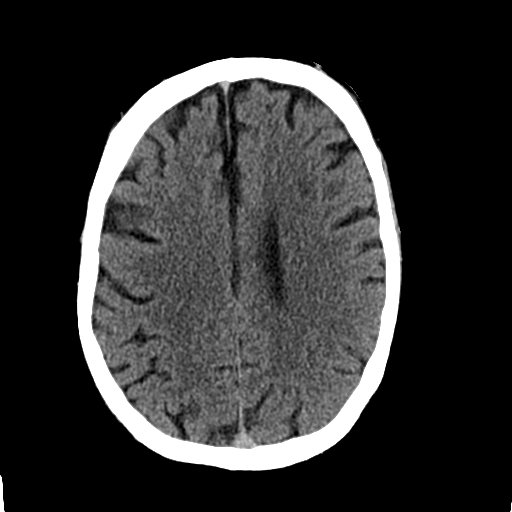
[im 21/33  bone]
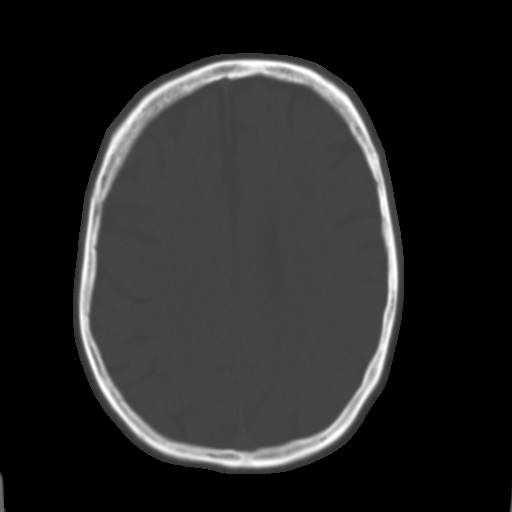
[im 25/33  brain]
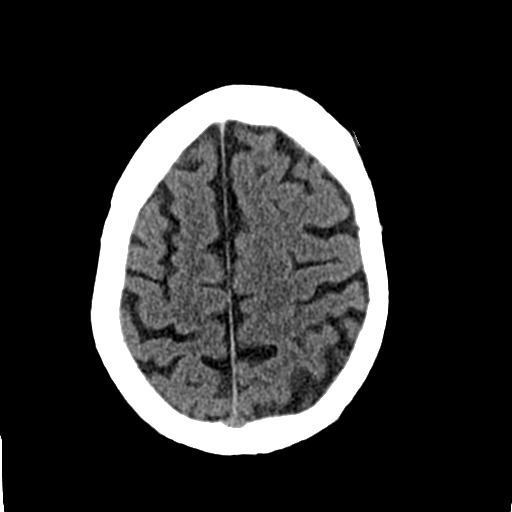
[im 29/33  brain]
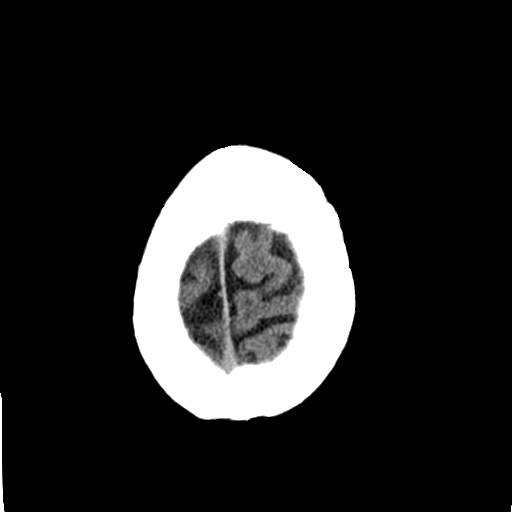

[Series 4: coronal soft tissue · coronal · 0.35mm/px · 3 of 79 slices shown]
[im 27/79  brain]
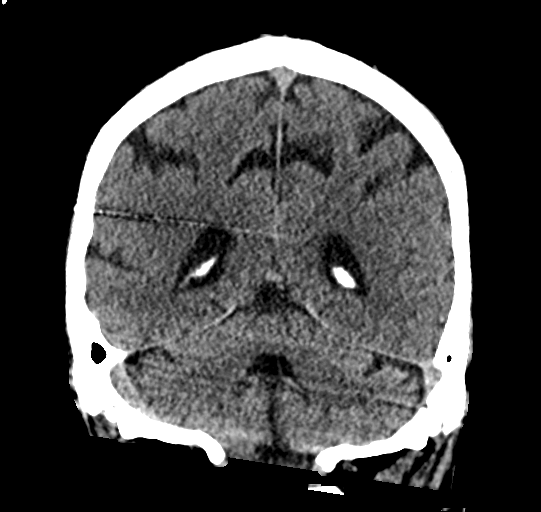
[im 35/79  brain]
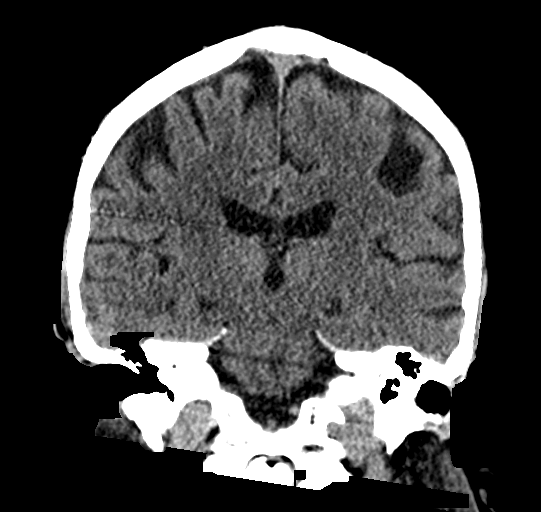
[im 44/79  brain]
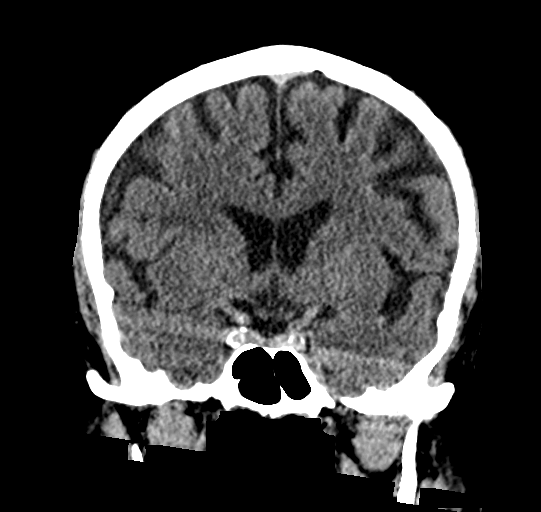

[Series 5: sagittal soft tissue · sagittal · 0.35mm/px · 3 of 55 slices shown]
[im 21/55  brain]
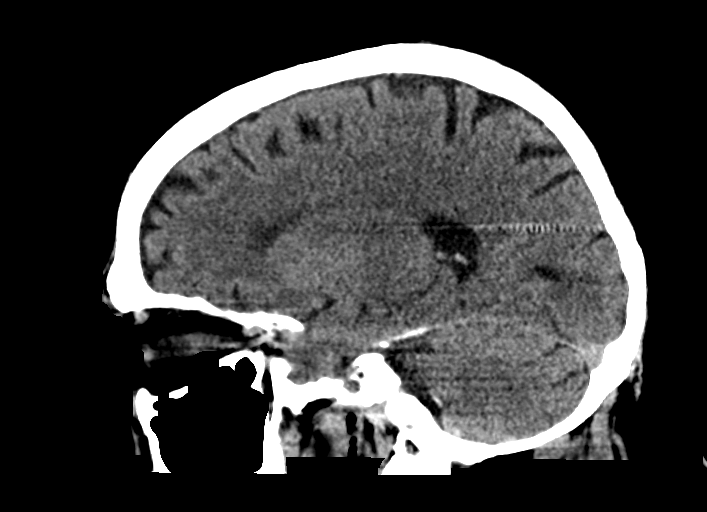
[im 28/55  brain]
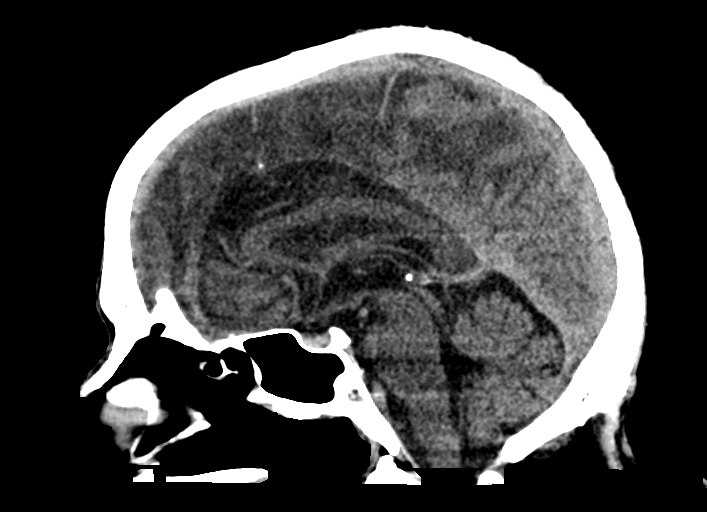
[im 34/55  brain]
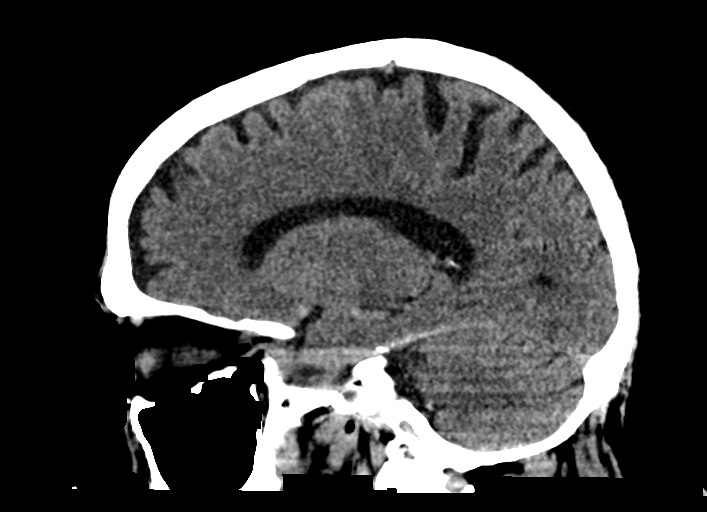

[16 of 47 positions shown; findings below may reference images not displayed]

FINDINGS: CT HEAD FINDINGS

Brain: Mild cerebral atrophy. Patchy and confluent areas of
decreased attenuation are noted throughout the deep and
periventricular white matter of the cerebral hemispheres
bilaterally, compatible with chronic microvascular ischemic disease.
No evidence of acute infarction, hemorrhage, hydrocephalus,
extra-axial collection or mass lesion/mass effect.

Vascular: No hyperdense vessel or unexpected calcification.

Skull: Normal. Negative for fracture or focal lesion.

Sinuses/Orbits: No acute finding.

Other: None.

CT CERVICAL SPINE FINDINGS

Alignment: Normal.

Skull base and vertebrae: No acute fracture. No primary bone lesion
or focal pathologic process.

Soft tissues and spinal canal: No prevertebral fluid or swelling. No
visible canal hematoma.

Disc levels: Multilevel degenerative disc disease, most severe at
C5-C6 and C6-C7. Mild multilevel facet arthropathy.

Upper chest: Negative.

Other: None.
IMPRESSION: 1. No acute intracranial abnormalities.
2. No acute abnormality of the cervical spine.
3. Mild cerebral atrophy with chronic microvascular ischemic changes
in the cerebral white matter, as above.
4. Mild multilevel degenerative disc disease and cervical
spondylosis, as above.
